# Patient Record
Sex: Female | Born: 1990 | Race: White | Hispanic: No | Marital: Single | Smoking: Former smoker
Health system: Southern US, Community
[De-identification: ages and names within clinical notes are randomized; demographics above are authoritative.]

## PROBLEM LIST (undated history)

## (undated) ENCOUNTER — Inpatient Hospital Stay (HOSPITAL_COMMUNITY): Payer: Self-pay

## (undated) DIAGNOSIS — Z789 Other specified health status: Secondary | ICD-10-CM

## (undated) HISTORY — PX: OTHER SURGICAL HISTORY: SHX169

---
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
## 2012-04-29 ENCOUNTER — Emergency Department (HOSPITAL_COMMUNITY)
Admission: EM | Admit: 2012-04-29 | Discharge: 2012-04-29 | Disposition: A | Discharge status: Home / Self Care | Payer: Self-pay

## 2012-04-29 NOTE — ED Notes (Signed)
Pt states she "just wants to go home and go to bed". Will come back tomorrow if she is not better.

## 2012-06-24 ENCOUNTER — Encounter (HOSPITAL_COMMUNITY): Payer: Self-pay | Admitting: Emergency Medicine

## 2012-06-24 ENCOUNTER — Emergency Department (HOSPITAL_COMMUNITY)
Admission: EM | Admit: 2012-06-24 | Discharge: 2012-06-24 | Disposition: A | Discharge status: Home / Self Care | Payer: Medicaid Other | Attending: Emergency Medicine | Admitting: Emergency Medicine

## 2012-06-24 DIAGNOSIS — Z3490 Encounter for supervision of normal pregnancy, unspecified, unspecified trimester: Secondary | ICD-10-CM

## 2012-06-24 DIAGNOSIS — E860 Dehydration: Secondary | ICD-10-CM | POA: Insufficient documentation

## 2012-06-24 DIAGNOSIS — Z331 Pregnant state, incidental: Secondary | ICD-10-CM | POA: Insufficient documentation

## 2012-06-24 DIAGNOSIS — R824 Acetonuria: Secondary | ICD-10-CM | POA: Insufficient documentation

## 2012-06-24 LAB — URINE MICROSCOPIC-ADD ON

## 2012-06-24 LAB — URINALYSIS, ROUTINE W REFLEX MICROSCOPIC
Bilirubin Urine: NEGATIVE
Glucose, UA: NEGATIVE mg/dL
Glucose, UA: NEGATIVE mg/dL
Hgb urine dipstick: NEGATIVE
Ketones, ur: >80 mg/dL — AB
Leukocytes, UA: NEGATIVE
Leukocytes, UA: NEGATIVE
Nitrite: NEGATIVE
Protein, ur: NEGATIVE mg/dL
Protein, ur: NEGATIVE mg/dL
Specific Gravity, Urine: 1.028 (ref 1.005–1.030)
Specific Gravity, Urine: 1.013 (ref 1.005–1.030)
Urobilinogen, UA: 1 mg/dL (ref 0.0–1.0)
Urobilinogen, UA: 1 mg/dL (ref 0.0–1.0)
pH: 6 (ref 5.0–8.0)

## 2012-06-24 LAB — POCT PREGNANCY, URINE: Preg Test, Ur: POSITIVE — AB

## 2012-06-24 MED ORDER — ONDANSETRON HCL 4 MG/2ML IJ SOLN
4.0000 mg | Freq: Once | INTRAMUSCULAR | Status: AC
  Administered 2012-06-24: 4 mg via INTRAVENOUS
  Filled 2012-06-24: qty 2

## 2012-06-24 MED ORDER — SODIUM CHLORIDE 0.9 % IV BOLUS (SEPSIS)
1000.0000 mL | Freq: Once | INTRAVENOUS | Status: AC
  Administered 2012-06-24: 1000 mL via INTRAVENOUS

## 2012-06-24 MED ORDER — PROMETHAZINE HCL 25 MG PO TABS: 25.0000 mg | ORAL_TABLET | Freq: Four times a day (QID) | ORAL | Status: DC | PRN

## 2012-06-24 MED ORDER — SODIUM CHLORIDE 0.9 % IV BOLUS (SEPSIS)
500.0000 mL | Freq: Once | INTRAVENOUS | Status: AC
  Administered 2012-06-24: 500 mL via INTRAVENOUS

## 2012-06-24 MED ORDER — PROMETHAZINE HCL 25 MG/ML IJ SOLN
25.0000 mg | Freq: Once | INTRAMUSCULAR | Status: AC
  Administered 2012-06-24: 25 mg via INTRAVENOUS
  Filled 2012-06-24: qty 1

## 2012-06-24 MED ORDER — SODIUM CHLORIDE 0.9 % IV BOLUS (SEPSIS)
1000.0000 mL | Freq: Once | INTRAVENOUS | Status: AC
  Administered 2012-06-24: 500 mL via INTRAVENOUS
  Administered 2012-06-24: 1000 mL via INTRAVENOUS

## 2012-06-24 MED ORDER — ONDANSETRON HCL 4 MG PO TABS: 4.0000 mg | ORAL_TABLET | Freq: Four times a day (QID) | ORAL | Status: AC

## 2012-06-24 NOTE — ED Notes (Signed)
Pt presenting to ed with c/o "i think I'm pregnant I have been really sick I took a pregnancy test and it was positive" pt states she was informed to present to Ed and get some phenergan pt states she has had nausea all her life and phenergan helps. Pt states she has not seen an ob/gyn yet. Pt states positive nausea and vomiting x 3 days.

## 2012-06-24 NOTE — ED Provider Notes (Signed)
Medical screening examination/treatment/procedure(s) were conducted as a shared visit with non-physician practitioner(s) and myself.  I personally evaluated the patient during the encounter  Nicolaos Mitrano, MD 06/24/12 2337 

## 2012-06-24 NOTE — ED Provider Notes (Addendum)
Medical screening examination/treatment/procedure(s) were conducted as a shared visit with non-physician practitioner(s) and myself.  I personally evaluated the patient during the encounter She is in first trimester. She c/o n/v for a few days. No diarrhea. No pain. No fever, chills, uti sxs.  On exam no distress.   Mild tachycardia.  + murmur with exhalation.  Lungs and abd nl.  + dehydration.  Will give iv fluids and zofran.   Cheri Guppy, MD 06/24/12 1813  9:29 PM Still nauseated  Cheri Guppy, MD 06/24/12 2129  sxs controlled now   Cheri Guppy, MD 06/24/12 2226

## 2012-06-24 NOTE — ED Provider Notes (Signed)
History     CSN: [id number]  Arrival date & time 06/24/12  1539   First MD Initiated Contact with Patient 06/24/12 1659      Chief Complaint  Patient presents with  . pregnant   . Morning Sickness    (Consider location/radiation/quality/duration/timing/severity/associated sxs/prior treatment) HPI Comments: Cathy [name] 21 y.o. female   The chief complaint is: Patient presents with:   pregnant    Morning Sickness   The patient has medical history significant for:   History reviewed. No pertinent past medical history.  Patient G1P0A0 presents with a chief complain of nausea every 20-30 minutes. Patient states that an at home pregnancy test was positive so she called her OB/GYN. He instructed her to come to the ED for hydration and antiemetics to avoid dehydration. Patient reports that she is 3-[redacted] weeks pregnant according to her menses. Denies fever or chills. Denies diarrhea, constipations or abdominal pain. Denies CP, SOB, or palpitations. Denies vaginal bleeding, dysuria, urgency, or frequency.      The history is provided by the patient.    History reviewed. No pertinent past medical history.  History reviewed. No pertinent past surgical history.  No family history on file.  History  Substance Use Topics  . Smoking status: Never Smoker   . Smokeless tobacco: Not on file  . Alcohol Use: No    OB History    Grav Para Term Preterm Abortions TAB SAB Ect Mult Living                  Review of Systems  Constitutional: Negative for fever, chills and activity change.  Respiratory: Negative for shortness of breath.   Cardiovascular: Negative for chest pain and palpitations.  Gastrointestinal: Positive for nausea and vomiting. Negative for abdominal pain and constipation.  Genitourinary: Negative for dysuria, urgency, frequency and vaginal bleeding.    Allergies  Cyclinex  Home Medications  No current outpatient prescriptions on file.  BP 110/68  Pulse  108  Temp 98.8 F (37.1 C) (Oral)  Resp 16  SpO2 99%  Physical Exam  Nursing note and vitals reviewed. Constitutional: She appears well-developed and well-nourished.  HENT:  Head: Normocephalic and atraumatic.  Mouth/Throat: Oropharynx is clear and moist.  Eyes: Conjunctivae and EOM are normal. Pupils are equal, round, and reactive to light. No scleral icterus.  Cardiovascular: Regular rhythm and normal heart sounds.   Pulmonary/Chest: Effort normal and breath sounds normal.  Abdominal: Soft. Bowel sounds are normal. There is no tenderness.  Neurological: She is alert.  Skin: Skin is warm and dry.    ED Course  Procedures (including critical care time)  Labs Reviewed  POCT PREGNANCY, URINE - Abnormal; Notable for the following:    Preg Test, Ur POSITIVE (*)     All other components within normal limits  URINALYSIS, ROUTINE W REFLEX MICROSCOPIC   No results found. Results for orders placed during the hospital encounter of 06/24/12  URINALYSIS, ROUTINE W REFLEX MICROSCOPIC      Component Value Range   Color, Urine YELLOW  YELLOW   APPearance CLOUDY (*) CLEAR   Specific Gravity, Urine 1.028  1.005 - 1.030   pH 6.0  5.0 - 8.0   Glucose, UA NEGATIVE  NEGATIVE mg/dL   Hgb urine dipstick NEGATIVE  NEGATIVE   Bilirubin Urine SMALL (*) NEGATIVE   Ketones, ur >80 (*) NEGATIVE mg/dL   Protein, ur NEGATIVE  NEGATIVE mg/dL   Urobilinogen, UA 1.0  0.0 - 1.0 mg/dL   Nitrite  NEGATIVE  NEGATIVE   Leukocytes, UA NEGATIVE  NEGATIVE  POCT PREGNANCY, URINE      Component Value Range   Preg Test, Ur POSITIVE (*) NEGATIVE     1. Pregnancy   2. Urine ketones   3. Dehydration       MDM  Patient presents 3-[redacted] weeks pregnant with complaint of nausea and vomiting every 20-30 minutes. She was instructed by her OB/GYN to report to the ED to avoid dehydration.UA: remarkable for Ketones: Fluids and Zofran given in the ER with symptom improvement. UA: repeated and ketones decreased. More  fluids given and repeat UA will determine time of discharge  Patient educated on the importance of hydration and limitation of caffeine. Patient instructed to start prenatal vitamin and folic acid. Patient discharged on Zofran with plans to see her OB/GYN on Monday. No red flags for abortion, ectopic pregnancy, or hyperemesis gravidarum. Return precautions given verbally and in discharge instructions. Patient signed off to Dr. Weldon Inches.        Pixie Casino, PA-C 06/24/12 2057

## 2012-06-24 NOTE — ED Notes (Signed)
Pt given something to drink.  MD aware and states that this is okay.

## 2012-06-24 NOTE — ED Notes (Signed)
Pt has already received 500cc of sodium bolus

## 2012-07-02 ENCOUNTER — Emergency Department (HOSPITAL_COMMUNITY)
Admission: EM | Admit: 2012-07-02 | Discharge: 2012-07-02 | Discharge status: Against Medical Advice | Payer: Medicaid Other | Attending: Emergency Medicine | Admitting: Emergency Medicine

## 2012-07-02 ENCOUNTER — Encounter (HOSPITAL_COMMUNITY): Payer: Self-pay | Admitting: *Deleted

## 2012-07-02 DIAGNOSIS — O210 Mild hyperemesis gravidarum: Secondary | ICD-10-CM | POA: Insufficient documentation

## 2012-07-02 NOTE — ED Notes (Signed)
Pt c/o continued emesis x 3 weeks. Pt is G1P0, 5.5 wks. Pt states unable to keep food and fluids down, unable to keep phenergan pills down at home.

## 2012-08-30 ENCOUNTER — Emergency Department (HOSPITAL_COMMUNITY)
Admission: EM | Admit: 2012-08-30 | Discharge: 2012-08-30 | Disposition: A | Discharge status: Home / Self Care | Payer: Medicaid Other | Attending: Emergency Medicine | Admitting: Emergency Medicine

## 2012-08-30 ENCOUNTER — Encounter (HOSPITAL_COMMUNITY): Payer: Self-pay | Admitting: Emergency Medicine

## 2012-08-30 DIAGNOSIS — Z331 Pregnant state, incidental: Secondary | ICD-10-CM | POA: Insufficient documentation

## 2012-08-30 DIAGNOSIS — E860 Dehydration: Secondary | ICD-10-CM | POA: Insufficient documentation

## 2012-08-30 DIAGNOSIS — Z3490 Encounter for supervision of normal pregnancy, unspecified, unspecified trimester: Secondary | ICD-10-CM

## 2012-08-30 LAB — POCT I-STAT, CHEM 8
BUN: 5 mg/dL — ABNORMAL LOW (ref 6–23)
HCT: 35 % — ABNORMAL LOW (ref 36.0–46.0)
Hemoglobin: 11.9 g/dL — ABNORMAL LOW (ref 12.0–15.0)
Sodium: 138 mEq/L (ref 135–145)
TCO2: 23 mmol/L (ref 0–100)

## 2012-08-30 LAB — GLUCOSE, CAPILLARY

## 2012-08-30 MED ORDER — SODIUM CHLORIDE 0.9 % IV BOLUS (SEPSIS)
1000.0000 mL | Freq: Once | INTRAVENOUS | Status: AC
  Administered 2012-08-30: 1000 mL via INTRAVENOUS

## 2012-08-30 MED ORDER — ONDANSETRON HCL 4 MG PO TABS: 4.0000 mg | ORAL_TABLET | Freq: Four times a day (QID) | ORAL | Status: DC

## 2012-08-30 MED ORDER — PRENATAL COMPLETE 14-0.4 MG PO TABS: 1.0000 | ORAL_TABLET | Freq: Every day | ORAL | Status: DC

## 2012-08-30 NOTE — ED Notes (Signed)
[id number]<AV> Expected date:<BR> Expected time:<BR> Means of arrival:<BR> Comments:<BR> Hold for triage 2

## 2012-08-30 NOTE — ED Notes (Signed)
Pt was taking a shower this morning and told her boyfriend that she thought she was going to pass out.  He caught her and gently lowered her to the floor.  She came to after "a minute or so". Pt states she is [redacted] wks pregnant.  Pt was last seen at 6 wks for a prenatal visit but is having trouble with medicaid.

## 2012-08-30 NOTE — ED Provider Notes (Signed)
Medical screening examination/treatment/procedure(s) were performed by non-physician practitioner and as supervising physician I was immediately available for consultation/collaboration.   Gianny Killman B. Bernette Mayers, MD 08/30/12 [phone]

## 2012-08-30 NOTE — ED Notes (Signed)
Pt reports taking a hot shower, becoming dizzy, and passing out for approximately a minute around 2 hr prior to arrival to the ER. Pt denies hitting head. Boyfriend caught her before she fell. Pt reports no difficulty with her pregnancy.

## 2012-08-30 NOTE — ED Provider Notes (Signed)
History     CSN: [id number]  Arrival date & time 08/30/12  1111   First MD Initiated Contact with Patient 08/30/12 1205      Chief Complaint  Patient presents with  . Loss of Consciousness  . Pregnant     [redacted] wks    (Consider location/radiation/quality/duration/timing/severity/associated sxs/prior treatment) HPI  The patient is here for brief fainting spell while in the hot shower this morning. Her boyfriend caught her,turned on cold water and she quickly came back around, he says. She says that the last time she ate was 8 PM yesterday night and in that she slept all through the night and woke up around 10 AM this morning. She states that she did not eat or drink anything from 8 PM up until now almost 1 PM the next day. She says that when she woke she got into the hot shower and after doing really hot she felt like she's going to faint, pulled her boyfriend and then did have a brief unconscious spell. She denies having any other associated symptoms of chest pain or N/V and she is completely back to baseline at this time. Confirmed IUP and fetal movements are normal. NAD/VSS  History reviewed. No pertinent past medical history.  History reviewed. No pertinent past surgical history.  History reviewed. No pertinent family history.  History  Substance Use Topics  . Smoking status: Never Smoker   . Smokeless tobacco: Not on file  . Alcohol Use: No    OB History    Grav Para Term Preterm Abortions TAB SAB Ect Mult Living   1               Review of Systems   Review of Systems  Gen: no weight loss, fevers, chills, night sweats, syncope Eyes: no discharge or drainage, no occular pain or visual changes  Nose: no epistaxis or rhinorrhea  Mouth: no dental pain, no sore throat  Neck: no neck pain  Lungs:No wheezing, coughing or hemoptysis CV: no chest pain, palpitations, dependent edema or orthopnea  Abd: no abdominal pain, nausea, vomiting  GU: no dysuria or gross hematuria    MSK:  No abnormalities  Neuro: no headache, no focal neurologic deficits  Skin: no abnormalities Psyche: negative.    Allergies  Cyclinex  Home Medications   Current Outpatient Rx  Name Route Sig Dispense Refill  . ACETAMINOPHEN 325 MG PO TABS Oral Take 650 mg by mouth every 6 (six) hours as needed. Pain    . PRENATAL 27-0.8 MG PO TABS Oral Take 1 tablet by mouth daily.      BP 95/63  Pulse 62  Temp 97.9 F (36.6 C) (Oral)  Resp 18  Ht 5\' 5"  (1.651 m)  SpO2 100%  LMP 05/16/2012  Physical Exam  Nursing note and vitals reviewed. Constitutional: She appears well-developed and well-nourished. No distress.  HENT:  Head: Normocephalic and atraumatic.  Eyes: Pupils are equal, round, and reactive to light.  Neck: Normal range of motion. Neck supple.  Cardiovascular: Normal rate and regular rhythm.   Pulmonary/Chest: Effort normal.  Abdominal: Soft.  Genitourinary: Uterus is enlarged. Uterus is not deviated, not fixed and not tender.  Neurological: She is alert.  Skin: Skin is warm and dry.    ED Course  Procedures (including critical care time)  Labs Reviewed  POCT I-STAT, CHEM 8 - Abnormal; Notable for the following:    BUN 5 (*)     Calcium, Ion 1.25 (*)  Hemoglobin 11.9 (*)     HCT 35.0 (*)     All other components within normal limits  GLUCOSE, CAPILLARY   No results found.   No diagnosis found. Dx; 1. Dehydration 2. Pregnancy  3. Orthostatic hypotension  4. pregnancy   MDM  Pt has positive orthostatics. She is asymptomatic in the ED. Labs are all non acute.  Pt received 2 L of normal Saline in the ED.   Date: 08/30/2012  Rate: 78  Rhythm: normal sinus rhythm  QRS Axis: normal  Intervals: normal  ST/T Wave abnormalities: normal  Conduction Disutrbances:none  Narrative Interpretation:   Old EKG Reviewed: unchanged  Pt has confirmed IUP from OB. She has recently moved and no longer has OB doctor.   Will give Zofran, Prenatals, education and  OB referral.  Pt has been advised of the symptoms that warrant their return to the ED. Patient has voiced understanding and has agreed to follow-up with the PCP or specialist.         Dorthula Matas, PA 08/30/12 1359

## 2012-10-20 LAB — OB RESULTS CONSOLE HEPATITIS B SURFACE ANTIGEN: Hepatitis B Surface Ag: NEGATIVE

## 2012-10-20 LAB — OB RESULTS CONSOLE ANTIBODY SCREEN: Antibody Screen: NEGATIVE

## 2012-10-20 LAB — OB RESULTS CONSOLE ABO/RH: RH Type: POSITIVE

## 2012-10-20 LAB — OB RESULTS CONSOLE GC/CHLAMYDIA: Chlamydia: NEGATIVE

## 2012-11-16 NOTE — L&D Delivery Note (Signed)
Patient was C/C/+2 and pushed for 10 minutes with epidural.   NSVD  female infant, Apgars 7/8, weight pending.   The patient had bilateral superficial labial lacerations reapproximated with 3-0 vicryl. Fundus was firm. EBL was expected. Placenta was delivered intact. Vagina was clear.  Baby was vigorous to bedside.  Cathy [name]

## 2013-01-14 ENCOUNTER — Encounter (HOSPITAL_COMMUNITY): Payer: Self-pay | Admitting: *Deleted

## 2013-01-14 ENCOUNTER — Inpatient Hospital Stay (HOSPITAL_COMMUNITY)
Admission: AD | Admit: 2013-01-14 | Discharge: 2013-01-14 | Disposition: A | Discharge status: Home / Self Care | Payer: Medicaid Other | Source: Ambulatory Visit | Attending: Obstetrics & Gynecology | Admitting: Obstetrics & Gynecology

## 2013-01-14 DIAGNOSIS — O479 False labor, unspecified: Secondary | ICD-10-CM

## 2013-01-14 DIAGNOSIS — M549 Dorsalgia, unspecified: Secondary | ICD-10-CM | POA: Insufficient documentation

## 2013-01-14 DIAGNOSIS — O4700 False labor before 37 completed weeks of gestation, unspecified trimester: Secondary | ICD-10-CM | POA: Insufficient documentation

## 2013-01-14 LAB — URINE MICROSCOPIC-ADD ON

## 2013-01-14 LAB — URINALYSIS, ROUTINE W REFLEX MICROSCOPIC
Glucose, UA: NEGATIVE mg/dL
Ketones, ur: >80 mg/dL — AB
Leukocytes, UA: NEGATIVE
Protein, ur: 30 mg/dL — AB
Urobilinogen, UA: 1 mg/dL (ref 0.0–1.0)

## 2013-01-14 MED ORDER — LACTATED RINGERS IV BOLUS (SEPSIS)
1000.0000 mL | Freq: Once | INTRAVENOUS | Status: AC
  Administered 2013-01-14: 1000 mL via INTRAVENOUS

## 2013-01-14 MED ORDER — IBUPROFEN 600 MG PO TABS
600.0000 mg | ORAL_TABLET | Freq: Once | ORAL | Status: AC
  Administered 2013-01-14: 600 mg via ORAL
  Filled 2013-01-14: qty 1

## 2013-01-14 MED ORDER — NIFEDIPINE 10 MG PO CAPS
10.0000 mg | ORAL_CAPSULE | Freq: Once | ORAL | Status: AC
  Administered 2013-01-14: 10 mg via ORAL
  Filled 2013-01-14: qty 1

## 2013-01-14 MED ORDER — DEXTROSE IN LACTATED RINGERS 5 % IV SOLN
INTRAVENOUS | Status: DC
  Administered 2013-01-14: 13:00:00 via INTRAVENOUS

## 2013-01-14 MED ORDER — PROMETHAZINE HCL 25 MG PO TABS
25.0000 mg | ORAL_TABLET | Freq: Once | ORAL | Status: AC
  Administered 2013-01-14: 25 mg via ORAL
  Filled 2013-01-14: qty 1

## 2013-01-14 NOTE — MAU Note (Addendum)
Pt reports she has been having a constant back pain like a pinched nerve (several weeks). Yesterday the pain got more intense  but more intermittent  Pian/cramping like menstrual  Cramping. Pain is not like it was last night more like pinched nerve pain now. Pt reports she has had N/V since Thursday. Not been able to keep anything down since 3am Thursday morning

## 2013-01-14 NOTE — MAU Provider Note (Signed)
History     CSN: [id number]  Arrival date and time: 01/14/13 1131   First Provider Initiated Contact with Patient 01/14/13 1218      Chief Complaint  Patient presents with  . Back Pain   HPI Ms. Cathy [name] is a 22 y.o. G1P0 at [redacted]w[redacted]d who presents to MAU today with complaint of back pain and pressure. The patient called Dr. Arlyce Dice and was instructed to come in for evaluation for preterm labor. The patient states that she started having these pains yesterday and they were coming and going like contractions every 15-20 minutes. They stopped last night around 7 pm and then she became very nauseous and was vomiting "a lot." When she woke up this morning they started again every 10-15 minutes. She denies fever, LOF or vaginal bleeding. She reports good fetal movement.   OB History   Grav Para Term Preterm Abortions TAB SAB Ect Mult Living   1               History reviewed. No pertinent past medical history.  History reviewed. No pertinent past surgical history.  History reviewed. No pertinent family history.  History  Substance Use Topics  . Smoking status: Never Smoker   . Smokeless tobacco: Not on file  . Alcohol Use: No    Allergies:  Allergies  Allergen Reactions  . Cyclinex (Tetracycline) Hives and Nausea And Vomiting  . Minocycline Hives and Nausea And Vomiting    Prescriptions prior to admission  Medication Sig Dispense Refill  . acetaminophen (TYLENOL) 325 MG tablet Take 650 mg by mouth every 6 (six) hours as needed. Pain      . Prenatal Vit-Fe Fumarate-FA (PRENATAL MULTIVITAMIN) TABS Take 1 tablet by mouth daily at 12 noon.        Review of Systems  Constitutional: Negative for fever, chills and malaise/fatigue.  Gastrointestinal: Positive for nausea and vomiting. Negative for abdominal pain.  Genitourinary: Negative for dysuria, urgency and frequency.  Musculoskeletal: Positive for back pain.   Physical Exam   Blood pressure 120/67, pulse 108,  temperature 98.1 F (36.7 C), temperature source Oral, resp. rate 18, height 5\' 5"  (1.651 m), weight 163 lb 6.4 oz (74.118 kg), last menstrual period 05/22/2012.  Physical Exam  Constitutional: She is oriented to person, place, and time. She appears well-developed and well-nourished.  HENT:  Head: Normocephalic and atraumatic.  Cardiovascular: Normal rate, regular rhythm and normal heart sounds.   Respiratory: Effort normal and breath sounds normal. No respiratory distress.  GI: Soft. Bowel sounds are normal. She exhibits no distension and no mass. There is no tenderness. There is no rebound and no guarding.  Neurological: She is alert and oriented to person, place, and time.  Skin: Skin is warm and dry. No erythema.  Psychiatric: She has a normal mood and affect.  Dilation: 1 Effacement (%): Thick Cervical Position: Middle Station: -3 Exam by:: Ginger Morris RN  Fetal Monitoring: Baseline: 150 bpm, moderate variability, + acceleration, no decelerations Contractions: q 2-4 minutes  Results for orders placed during the hospital encounter of 01/14/13 (from the past 24 hour(s))  URINALYSIS, ROUTINE W REFLEX MICROSCOPIC     Status: Abnormal   Collection Time    01/14/13 11:45 AM      Result Value Range   Color, Urine YELLOW  YELLOW   APPearance CLEAR  CLEAR   Specific Gravity, Urine >1.030 (*) 1.005 - 1.030   pH 6.0  5.0 - 8.0   Glucose, UA NEGATIVE  NEGATIVE mg/dL   Hgb urine dipstick MODERATE (*) NEGATIVE   Bilirubin Urine NEGATIVE  NEGATIVE   Ketones, ur >80 (*) NEGATIVE mg/dL   Protein, ur 30 (*) NEGATIVE mg/dL   Urobilinogen, UA 1.0  0.0 - 1.0 mg/dL   Nitrite NEGATIVE  NEGATIVE   Leukocytes, UA NEGATIVE  NEGATIVE  URINE MICROSCOPIC-ADD ON     Status: Abnormal   Collection Time    01/14/13 11:45 AM      Result Value Range   Squamous Epithelial / LPF FEW (*) RARE   WBC, UA 3-6  <3 WBC/hpf   RBC / HPF 11-20  <3 RBC/hpf   Bacteria, UA FEW (*) RARE    MAU Course   Procedures None  MDM 1 L LR IV Discussed patient with Dr. Arlyce Dice. He recommends10 mg procardia now 1253 - Procardia given Fetal Monitoring: Baseline: 150 bpm, moderate activity, + acceleration, no deceleration Contractions: q 6 minutes, progressed to irritability Patient is reporting less contractions but still having significant back pain Discussed patient with Dr. Arlyce Dice. He recommends 600 mg one time dose of Ibuprofen. Patient may be discharged after fluids have finished. Follow-up in the office this week.  Rechecked cervix: No change in dilation; more posterior now that contractions have stopped.  At time of discharge patient feels nauseous. Phenergan given PO prior to discharge. Patient has Rx for Zofran and Phenergan at home.   Assessment and Plan  A: Preterm contractions  P: Discharge home Labor precautions discussed Patient will call to make follow-up appointment in the office this week Patient may return to MAU as needed or if her condition were to change or worsen  Freddi Starr, PA-C  01/14/2013, 12:18 PM

## 2013-01-14 NOTE — Progress Notes (Signed)
Patient called with complaints of back pain and pressure that might be consistent with premature labor.  I asked her to go th MAU for cervical evaluation to rule out preterm labor.

## 2013-01-15 LAB — URINE CULTURE

## 2013-02-07 ENCOUNTER — Other Ambulatory Visit: Payer: Self-pay | Admitting: Obstetrics and Gynecology

## 2013-02-25 ENCOUNTER — Encounter (HOSPITAL_COMMUNITY): Payer: Self-pay | Admitting: *Deleted

## 2013-02-25 ENCOUNTER — Encounter (HOSPITAL_COMMUNITY): Payer: Self-pay | Admitting: Anesthesiology

## 2013-02-25 ENCOUNTER — Inpatient Hospital Stay (HOSPITAL_COMMUNITY)
Admission: AD | Admit: 2013-02-25 | Discharge: 2013-02-27 | DRG: 775 | Disposition: A | Discharge status: Home / Self Care | Payer: Medicaid Other | Source: Ambulatory Visit | Attending: Obstetrics and Gynecology | Admitting: Obstetrics and Gynecology

## 2013-02-25 ENCOUNTER — Inpatient Hospital Stay (HOSPITAL_COMMUNITY): Payer: Medicaid Other | Admitting: Anesthesiology

## 2013-02-25 DIAGNOSIS — Z3480 Encounter for supervision of other normal pregnancy, unspecified trimester: Secondary | ICD-10-CM

## 2013-02-25 LAB — CBC
HCT: 34.9 % — ABNORMAL LOW (ref 36.0–46.0)
Hemoglobin: 11.7 g/dL — ABNORMAL LOW (ref 12.0–15.0)
MCH: 31.3 pg (ref 26.0–34.0)
MCHC: 33.5 g/dL (ref 30.0–36.0)
MCV: 93.3 fL (ref 78.0–100.0)
RDW: 14.5 % (ref 11.5–15.5)

## 2013-02-25 MED ORDER — PRENATAL MULTIVITAMIN CH
1.0000 | ORAL_TABLET | Freq: Every day | ORAL | Status: DC
  Administered 2013-02-26 – 2013-02-27 (×2): 1 via ORAL
  Filled 2013-02-25 (×2): qty 1

## 2013-02-25 MED ORDER — LACTATED RINGERS IV SOLN
INTRAVENOUS | Status: DC
  Administered 2013-02-25: 13:00:00 via INTRAVENOUS

## 2013-02-25 MED ORDER — BENZOCAINE-MENTHOL 20-0.5 % EX AERO
1.0000 "application " | INHALATION_SPRAY | CUTANEOUS | Status: DC | PRN
  Administered 2013-02-26: 1 via TOPICAL
  Filled 2013-02-25 (×2): qty 56

## 2013-02-25 MED ORDER — EPHEDRINE 5 MG/ML INJ: 10.0000 mg | INTRAVENOUS | Status: DC | PRN

## 2013-02-25 MED ORDER — OXYCODONE-ACETAMINOPHEN 5-325 MG PO TABS: 1.0000 | ORAL_TABLET | ORAL | Status: DC | PRN

## 2013-02-25 MED ORDER — LIDOCAINE HCL (PF) 1 % IJ SOLN
INTRAMUSCULAR | Status: DC | PRN
  Administered 2013-02-25: 3 mL
  Administered 2013-02-25 (×2): 5 mL

## 2013-02-25 MED ORDER — WITCH HAZEL-GLYCERIN EX PADS: 1.0000 "application " | MEDICATED_PAD | CUTANEOUS | Status: DC | PRN

## 2013-02-25 MED ORDER — OXYTOCIN BOLUS FROM INFUSION: 500.0000 mL | INTRAVENOUS | Status: DC

## 2013-02-25 MED ORDER — SIMETHICONE 80 MG PO CHEW: 80.0000 mg | CHEWABLE_TABLET | ORAL | Status: DC | PRN

## 2013-02-25 MED ORDER — ONDANSETRON HCL 4 MG/2ML IJ SOLN: 4.0000 mg | Freq: Four times a day (QID) | INTRAMUSCULAR | Status: DC | PRN

## 2013-02-25 MED ORDER — ONDANSETRON HCL 4 MG PO TABS
4.0000 mg | ORAL_TABLET | ORAL | Status: DC | PRN
  Administered 2013-02-25: 4 mg via ORAL
  Filled 2013-02-25: qty 1

## 2013-02-25 MED ORDER — IBUPROFEN 600 MG PO TABS
600.0000 mg | ORAL_TABLET | Freq: Four times a day (QID) | ORAL | Status: DC
  Administered 2013-02-25 – 2013-02-27 (×8): 600 mg via ORAL
  Filled 2013-02-25 (×8): qty 1

## 2013-02-25 MED ORDER — PHENYLEPHRINE 40 MCG/ML (10ML) SYRINGE FOR IV PUSH (FOR BLOOD PRESSURE SUPPORT)
80.0000 ug | PREFILLED_SYRINGE | INTRAVENOUS | Status: DC | PRN
  Filled 2013-02-25: qty 5

## 2013-02-25 MED ORDER — LACTATED RINGERS IV SOLN
500.0000 mL | Freq: Once | INTRAVENOUS | Status: AC
  Administered 2013-02-25: 1000 mL via INTRAVENOUS

## 2013-02-25 MED ORDER — ONDANSETRON HCL 4 MG/2ML IJ SOLN: 4.0000 mg | INTRAMUSCULAR | Status: DC | PRN

## 2013-02-25 MED ORDER — EPHEDRINE 5 MG/ML INJ
10.0000 mg | INTRAVENOUS | Status: DC | PRN
  Filled 2013-02-25: qty 4

## 2013-02-25 MED ORDER — LIDOCAINE HCL (PF) 1 % IJ SOLN
30.0000 mL | INTRAMUSCULAR | Status: DC | PRN
  Filled 2013-02-25: qty 30

## 2013-02-25 MED ORDER — CITRIC ACID-SODIUM CITRATE 334-500 MG/5ML PO SOLN: 30.0000 mL | ORAL | Status: DC | PRN

## 2013-02-25 MED ORDER — PHENYLEPHRINE 40 MCG/ML (10ML) SYRINGE FOR IV PUSH (FOR BLOOD PRESSURE SUPPORT): 80.0000 ug | PREFILLED_SYRINGE | INTRAVENOUS | Status: DC | PRN

## 2013-02-25 MED ORDER — TETANUS-DIPHTH-ACELL PERTUSSIS 5-2.5-18.5 LF-MCG/0.5 IM SUSP
0.5000 mL | Freq: Once | INTRAMUSCULAR | Status: AC
  Administered 2013-02-27: 0.5 mL via INTRAMUSCULAR
  Filled 2013-02-25 (×2): qty 0.5

## 2013-02-25 MED ORDER — ACETAMINOPHEN 325 MG PO TABS: 650.0000 mg | ORAL_TABLET | ORAL | Status: DC | PRN

## 2013-02-25 MED ORDER — SENNOSIDES-DOCUSATE SODIUM 8.6-50 MG PO TABS
2.0000 | ORAL_TABLET | Freq: Every day | ORAL | Status: DC
  Administered 2013-02-25 – 2013-02-26 (×2): 2 via ORAL

## 2013-02-25 MED ORDER — LANOLIN HYDROUS EX OINT: TOPICAL_OINTMENT | CUTANEOUS | Status: DC | PRN

## 2013-02-25 MED ORDER — FENTANYL 2.5 MCG/ML BUPIVACAINE 1/10 % EPIDURAL INFUSION (WH - ANES)
INTRAMUSCULAR | Status: DC | PRN
  Administered 2013-02-25: 14 mL/h via EPIDURAL

## 2013-02-25 MED ORDER — BUTORPHANOL TARTRATE 1 MG/ML IJ SOLN
INTRAMUSCULAR | Status: AC
  Administered 2013-02-25: 1 mg via INTRAMUSCULAR
  Filled 2013-02-25: qty 1

## 2013-02-25 MED ORDER — LACTATED RINGERS IV SOLN: 500.0000 mL | INTRAVENOUS | Status: DC | PRN

## 2013-02-25 MED ORDER — DIBUCAINE 1 % RE OINT
1.0000 "application " | TOPICAL_OINTMENT | RECTAL | Status: DC | PRN
  Filled 2013-02-25: qty 28

## 2013-02-25 MED ORDER — FENTANYL 2.5 MCG/ML BUPIVACAINE 1/10 % EPIDURAL INFUSION (WH - ANES)
14.0000 mL/h | INTRAMUSCULAR | Status: DC | PRN
  Filled 2013-02-25: qty 125

## 2013-02-25 MED ORDER — FENTANYL 2.5 MCG/ML BUPIVACAINE 1/10 % EPIDURAL INFUSION (WH - ANES): 14.0000 mL/h | INTRAMUSCULAR | Status: DC | PRN

## 2013-02-25 MED ORDER — DIPHENHYDRAMINE HCL 50 MG/ML IJ SOLN: 12.5000 mg | INTRAMUSCULAR | Status: DC | PRN

## 2013-02-25 MED ORDER — IBUPROFEN 600 MG PO TABS: 600.0000 mg | ORAL_TABLET | Freq: Four times a day (QID) | ORAL | Status: DC | PRN

## 2013-02-25 MED ORDER — BUTORPHANOL TARTRATE 1 MG/ML IJ SOLN
1.0000 mg | Freq: Once | INTRAMUSCULAR | Status: AC
  Administered 2013-02-25: 1 mg via INTRAMUSCULAR

## 2013-02-25 MED ORDER — DIPHENHYDRAMINE HCL 25 MG PO CAPS: 25.0000 mg | ORAL_CAPSULE | Freq: Four times a day (QID) | ORAL | Status: DC | PRN

## 2013-02-25 MED ORDER — FLEET ENEMA 7-19 GM/118ML RE ENEM: 1.0000 | ENEMA | RECTAL | Status: DC | PRN

## 2013-02-25 MED ORDER — OXYTOCIN 40 UNITS IN LACTATED RINGERS INFUSION - SIMPLE MED
62.5000 mL/h | INTRAVENOUS | Status: DC
  Administered 2013-02-25: 100 mL/h via INTRAVENOUS
  Administered 2013-02-25: 999 mL/h via INTRAVENOUS
  Filled 2013-02-25: qty 1000

## 2013-02-25 MED ORDER — ZOLPIDEM TARTRATE 5 MG PO TABS: 5.0000 mg | ORAL_TABLET | Freq: Every evening | ORAL | Status: DC | PRN

## 2013-02-25 NOTE — Anesthesia Procedure Notes (Signed)
Epidural Patient location during procedure: OB  Staffing Anesthesiologist: Jerzy Roepke Performed by: anesthesiologist   Preanesthetic Checklist Completed: patient identified, site marked, surgical consent, pre-op evaluation, timeout performed, IV checked, risks and benefits discussed and monitors and equipment checked  Epidural Patient position: sitting Prep: ChloraPrep Patient monitoring: heart rate, continuous pulse ox and blood pressure Approach: right paramedian Injection technique: LOR saline  Needle:  Needle type: Tuohy  Needle gauge: 17 G Needle length: 9 cm and 9 Needle insertion depth: 7 cm Catheter type: closed end flexible Catheter size: 20 Guage Catheter at skin depth: 13 cm Test dose: negative  Assessment Events: blood not aspirated, injection not painful, no injection resistance, negative IV test and no paresthesia  Additional Notes   Patient tolerated the insertion well without complications.   

## 2013-02-25 NOTE — Anesthesia Preprocedure Evaluation (Signed)

## 2013-02-25 NOTE — H&P (Signed)
22 y.o. [redacted]w[redacted]d  G1P0 comes in c/o ctx.  Was seen at ER in [facility] last night and found to be 1cm.  Presented to MAU this morning and was 2.5cm.  Recheck 3cm with "bulging bag" and very uncomfortable, writhing in pain.  Otherwise has good fetal movement and no bleeding.  History reviewed. No pertinent past medical history. History reviewed. No pertinent past surgical history.  OB History   Grav Para Term Preterm Abortions TAB SAB Ect Mult Living   1              # Outc Date GA Lbr Len/2nd Wgt Sex Del Anes PTL Lv   1 CUR               History   Social History  . Marital Status: Single    Spouse Name: N/A    Number of Children: N/A  . Years of Education: N/A   Occupational History  . Not on file.   Social History Main Topics  . Smoking status: Never Smoker   . Smokeless tobacco: Not on file  . Alcohol Use: No  . Drug Use: Yes    Special: Marijuana  . Sexually Active: Yes    Birth Control/ Protection: None   Other Topics Concern  . Not on file   Social History Narrative  . No narrative on file   Cyclinex and Minocycline    Prenatal Transfer Tool  Maternal Diabetes: No Genetic Screening: Declined Maternal Ultrasounds/Referrals: Normal - per pt, she transferred to [place] at 21wk, anatomy scan was not faxed to hospital, but pt states she was told everything looked fine and no further [place] needed Fetal Ultrasounds or other Referrals:  None Maternal Substance Abuse:  Yes:  Type: Marijuana Significant Maternal Medications:  None Significant Maternal Lab Results: Lab values include: Group B Strep negative  Other PNC: uncomplicated    Filed Vitals:   02/25/13 1307  BP: 123/75  Pulse: 100  Temp:   Resp: 18     Lungs/Cor:  NAD Abdomen:  soft, gravid Ex:  no cords, erythema SVE:  4/90/0 FHTs:  135, good STV, NST R Toco:  q 4   A/P   Admit to L&D in labor Pt received epidural, feeling more comfortable AROM clear Routine care  GBS Neg  Maeson Purohit

## 2013-02-25 NOTE — MAU Note (Signed)
Pt reprots having ctx since yesterday. Now they are 5-10 min apart and more intense. Reprots some bloody show. Good fetal movement reported.

## 2013-02-26 LAB — CBC
HCT: 28.4 % — ABNORMAL LOW (ref 36.0–46.0)
Hemoglobin: 9.4 g/dL — ABNORMAL LOW (ref 12.0–15.0)
MCH: 30.9 pg (ref 26.0–34.0)
MCV: 93.4 fL (ref 78.0–100.0)
Platelets: 179 10*3/uL (ref 150–400)
RBC: 3.04 MIL/uL — ABNORMAL LOW (ref 3.87–5.11)
WBC: 15.5 10*3/uL — ABNORMAL HIGH (ref 4.0–10.5)

## 2013-02-26 NOTE — Progress Notes (Signed)
Patient is eating, ambulating, voiding.  Pain control is good.  Appropriate lochia.  No complaints.  Filed Vitals:   02/25/13 1947 02/25/13 2030 02/25/13 2130 02/26/13 0130  BP: 133/51 138/72 130/72 93/55  Pulse: 80 64 77 61  Temp:  98.6 F (37 C) 98.5 F (36.9 C) 98.3 F (36.8 C)  TempSrc:  Oral Oral Oral  Resp: 20 20 20 18   Height:      Weight:      SpO2:        Fundus firm Perineum without swelling. No CT  Lab Results  Component Value Date   WBC 15.5* 02/26/2013   HGB 9.4* 02/26/2013   HCT 28.4* 02/26/2013   MCV 93.4 02/26/2013   PLT 179 02/26/2013    A/Positive/-- (12/05 0000)  A/P Post partum day 1.  Routine care.  Expect d/c 4/14.    Philip Aspen

## 2013-02-26 NOTE — Clinical Social Work Note (Signed)
Clinical Social Work Department PSYCHOSOCIAL ASSESSMENT - MATERNAL/CHILD 02/26/2013  Patient:  Cathy [name],Cathy [name]  Account Number:  [id number]  Admit Date:  02/25/2013  Childs Name:   Cathy [name]    Clinical Social Worker:  Kareli Hossain, LCSW   Date/Time:  02/26/2013 12:30 PM  Date Referred:  02/26/2013   Referral source  Physician     Referred reason  Substance Abuse   Other referral source:    I:  FAMILY / HOME ENVIRONMENT Child's legal guardian:  PARENT  Guardian - Name Guardian - Age Guardian - Address  Cathy [name] 22 [address] [place], [place] [address]  Cathy [name]  [address] [place], [place] [address]   Other household support members/support persons Name Relationship DOB  Sherrie Smisek GRAND MOTHER   Jerry (grandmother's boyfriend)     Other support:   MOB and FOB report good family support    II  PSYCHOSOCIAL DATA Information Source:  Patient Interview  Financial and Community Resources Employment:   MOB unemployed  FOB starting employment at [employer]   Financial resources:  Medicaid If Medicaid - County:  [place] Other  Food Stamps  WIC   School / Grade:   Maternity Care Coordinator / Child Services Coordination / Early Interventions:  Cultural issues impacting care:    III  STRENGTHS Strengths  Supportive family/friends  Home prepared for Child (including basic supplies)  Adequate Resources   Strength comment:    IV  RISK FACTORS AND CURRENT PROBLEMS Current Problem:  YES   Risk Factor & Current Problem Patient Issue Family Issue Risk Factor / Current Problem Comment  Substance Abuse Y N infant positive for MJ   N N     V  SOCIAL WORK ASSESSMENT CSW entered MOB's room to MOB and 3 visitors.  CSW asked visitors to leave so that she could speak with MOB privately. CSW discussed any current emotional concerns with MOB.  MOB reports no emotional concerns and no hx of any anxiety or depression.  CSW discussed home  environment. MOB reports she and FOB recently moved in with her mother and her mother's boyfriend in [place] and they are currently the only ones in the home.  CSW discussed medicaid.  MOB reports medicaid is still with [place], however MOB is in the process of transferring to [place].  CSW discussed family support and MOB reported good family support and no current concerns.  CSW discussed any concerns with supplies and MOB reported there is none.  CSW discussed positive drug screen with MOB and hx of use.  MOB reports first used at age 22 and used socially since that time.  MOB reports she had stopped when she found out she was pregnant, and then reported having severe nausea during pregnancy and that "nothing else worked" to control her nausea.  CSW discussed hospital policy to report any positive drug screens to DSS.  MOB was understanding. CSW contacted [place] DSS and made report.  CPS worker informed CSW that a worker would be out to the hospital Monday 4/14 to assess and decide discharge for MOB.  CSW informed MOB and RN about planned visit.  MOB asked that if possible for nurse assistance to remove any visitors from the room when DSS arrives as she does not want other family or friends to know about DSS involvement. MOB reports her mother does know about use.  CSW instructed DSS to check at nurses station before entering room.  CSW will continue   to follow to assist with discharge planning.  Do not discharge infant or MOB until CSW indicates okay from DSS.      VI SOCIAL WORK PLAN Social Work Plan  Psychosocial Support/Ongoing Assessment of Needs   Type of pt/family education:   If child protective services report - county:   If child protective services report - date:   Information/referral to community resources comment:   Other social work plan:    

## 2013-02-26 NOTE — Anesthesia Postprocedure Evaluation (Signed)
  Anesthesia Post-op Note  Patient: Cathy [name]  Procedure(s) Performed: * No procedures listed *  Patient Location: PACU and Mother/Baby  Anesthesia Type:Epidural  Level of Consciousness: awake, alert  and oriented  Airway and Oxygen Therapy: Patient Spontanous Breathing  Post-op Pain: none  Post-op Assessment: Post-op Vital signs reviewed, Patient's Cardiovascular Status Stable, No headache, No backache, No residual numbness and No residual motor weakness  Post-op Vital Signs: Reviewed and stable  Complications: No apparent anesthesia complications

## 2013-02-27 LAB — RPR: RPR Ser Ql: NONREACTIVE

## 2013-02-27 MED ORDER — IBUPROFEN 600 MG PO TABS: 600.0000 mg | ORAL_TABLET | Freq: Four times a day (QID) | ORAL | Status: DC

## 2013-02-27 MED ORDER — SENNOSIDES-DOCUSATE SODIUM 8.6-50 MG PO TABS: 2.0000 | ORAL_TABLET | Freq: Every day | ORAL | Status: DC

## 2013-02-27 NOTE — Discharge Summary (Signed)
Obstetric Discharge Summary Reason for Admission: onset of labor Prenatal Procedures: ultrasound Intrapartum Procedures: spontaneous vaginal delivery Postpartum Procedures: none Complications-Operative and Postpartum: bilateral labial lacerations Hemoglobin  Date Value Range Status  02/26/2013 9.4* 12.0 - 15.0 g/dL Final     REPEATED TO VERIFY     DELTA CHECK NOTED     HCT  Date Value Range Status  02/26/2013 28.4* 36.0 - 46.0 % Final    Physical Exam:  General: alert Lochia: appropriate Uterine Fundus: firm   Discharge Diagnoses: Term Pregnancy-delivered  Discharge Information: Date: 02/27/2013 Activity: pelvic rest Diet: routine Medications: PNV and Ibuprofen Condition: stable Instructions: refer to practice specific booklet Discharge to: home Follow-up Information   Follow up with Levi Aland, MD. Schedule an appointment as soon as possible for a visit in 4 weeks.   Contact information:   [address] [place] [address] [phone]       Newborn Data: Live born female  Birth Weight: 7 lb 1.2 oz (3210 g) APGAR: 7, 9  Home with mother.  Cathy [name] 02/27/2013, 8:33 AM

## 2013-02-27 NOTE — Progress Notes (Signed)
CSW spoke with Carrie Nelson/[place] CPS who states they requested an assist from [place] this morning, but have not received information as to who the case has been assigned to.  CSW attempted to contact [place] to see who the case has been assigned to, but was on hold for over 15 minutes and could not hold any longer.  CSW will attempt again a little later. 

## 2013-02-27 NOTE — Progress Notes (Signed)
CSW has tried numerous times and has left messages at [facility] DSS to inquire about plan, but has not been able to talk with anyone.  CSW left message for C. Nelson/[place] CPS stating that if [place] does not come to see MOB today before 5pm, we will let them discharge and Joni Fears will need to follow up in the home.  CSW has met with parents to update them on the situation and discuss the reason for the delay in discharge today.  They were very pleasant and understanding.

## 2013-02-27 NOTE — Progress Notes (Signed)
UR chart review completed.  

## 2013-02-27 NOTE — Progress Notes (Signed)
Pt and baby are doing well. Ready for discharge.

## 2013-02-28 NOTE — Progress Notes (Signed)
Late Entry: CSW saw CPS Earnestine Mealing who met with parents and cleared them for discharge.  She states Joni Fears will follow up with parents at home.  CSW informed hospital staff.

## 2013-05-29 ENCOUNTER — Encounter (HOSPITAL_COMMUNITY): Payer: Self-pay | Admitting: Emergency Medicine

## 2013-05-29 ENCOUNTER — Emergency Department (HOSPITAL_COMMUNITY)
Admission: EM | Admit: 2013-05-29 | Discharge: 2013-05-29 | Disposition: A | Discharge status: Home / Self Care | Payer: Medicaid Other | Attending: Emergency Medicine | Admitting: Emergency Medicine

## 2013-05-29 DIAGNOSIS — K0889 Other specified disorders of teeth and supporting structures: Secondary | ICD-10-CM

## 2013-05-29 DIAGNOSIS — K089 Disorder of teeth and supporting structures, unspecified: Secondary | ICD-10-CM | POA: Insufficient documentation

## 2013-05-29 MED ORDER — HYDROCODONE-ACETAMINOPHEN 5-325 MG PO TABS: 1.0000 | ORAL_TABLET | ORAL | Status: DC | PRN

## 2013-05-29 NOTE — ED Notes (Signed)
Per pt, states toothache started 11 months ago-right upper wisdom tooth-unable to go to dentist because of insurance reasons

## 2013-06-04 NOTE — ED Provider Notes (Signed)
   History    CSN: [id number] Arrival date & time 05/29/13  4098  First MD Initiated Contact with Patient 05/29/13 [phone]     Chief Complaint  Patient presents with  . Dental Pain   (Consider location/radiation/quality/duration/timing/severity/associated sxs/prior Treatment) Patient is a 22 y.o. female presenting with tooth pain. The history is provided by the patient. No language interpreter was used.  Dental Pain Location:  Upper Quality:  Pulsating, aching and constant Associated symptoms: no facial swelling and no fever   Associated symptoms comment:  Chronic but worsening dental pain at upper right rear molar. No facial swelling or fever.   History reviewed. No pertinent past medical history. History reviewed. No pertinent past surgical history. No family history on file. History  Substance Use Topics  . Smoking status: Never Smoker   . Smokeless tobacco: Not on file  . Alcohol Use: No   OB History   Grav Para Term Preterm Abortions TAB SAB Ect Mult Living   1 1 1       1      Review of Systems  Constitutional: Negative for fever and chills.  HENT: Positive for dental problem. Negative for facial swelling and trouble swallowing.   Gastrointestinal: Negative.     Allergies  Cyclinex and Minocycline  Home Medications   Current Outpatient Rx  Name  Route  Sig  Dispense  Refill  . acetaminophen (TYLENOL) 325 MG tablet   Oral   Take 650 mg by mouth every 6 (six) hours as needed. Pain         . HYDROcodone-acetaminophen (NORCO/VICODIN) 5-325 MG per tablet   Oral   Take 1 tablet by mouth every 4 (four) hours as needed for pain.   12 tablet   0   . ibuprofen (ADVIL,MOTRIN) 600 MG tablet   Oral   Take 1 tablet (600 mg total) by mouth every 6 (six) hours.   30 tablet   0   . Prenatal Vit-Fe Fumarate-FA (PRENATAL MULTIVITAMIN) TABS   Oral   Take 1 tablet by mouth daily at 12 noon.         . senna-docusate (SENOKOT-S) 8.6-50 MG per tablet   Oral   Take 2  tablets by mouth at bedtime.   60 tablet   0    BP 113/71  Pulse 85  Temp(Src) 98.6 F (37 C) (Oral)  Resp 16  SpO2 100%  LMP 05/23/2013 Physical Exam  Constitutional: She is oriented to person, place, and time. She appears well-developed and well-nourished.  HENT:  Generally good dentition. Mild swelling at partially erupted upper rear molar.   Neck: Normal range of motion.  Pulmonary/Chest: Effort normal.  Neurological: She is alert and oriented to person, place, and time.  Skin: Skin is warm and dry.    ED Course  Procedures (including critical care time) Labs Reviewed - No data to display No results found. 1. Pain, dental     MDM  No evidence to suggest abscess.   Arnoldo Hooker, PA-C 06/04/13 1551

## 2013-06-05 NOTE — ED Provider Notes (Signed)
Medical screening examination/treatment/procedure(s) were performed by non-physician practitioner and as supervising physician I was immediately available for consultation/collaboration.  Candyce Churn, MD 06/05/13 [phone]

## 2014-02-05 ENCOUNTER — Encounter (HOSPITAL_COMMUNITY): Payer: Self-pay | Admitting: Emergency Medicine

## 2014-02-05 ENCOUNTER — Emergency Department (HOSPITAL_COMMUNITY)
Admission: EM | Admit: 2014-02-05 | Discharge: 2014-02-06 | Disposition: A | Discharge status: Home / Self Care | Payer: Medicaid Other | Attending: Emergency Medicine | Admitting: Emergency Medicine

## 2014-02-05 DIAGNOSIS — R059 Cough, unspecified: Secondary | ICD-10-CM | POA: Insufficient documentation

## 2014-02-05 DIAGNOSIS — A088 Other specified intestinal infections: Secondary | ICD-10-CM | POA: Insufficient documentation

## 2014-02-05 DIAGNOSIS — A084 Viral intestinal infection, unspecified: Secondary | ICD-10-CM

## 2014-02-05 DIAGNOSIS — R05 Cough: Secondary | ICD-10-CM | POA: Insufficient documentation

## 2014-02-05 DIAGNOSIS — Z3202 Encounter for pregnancy test, result negative: Secondary | ICD-10-CM | POA: Insufficient documentation

## 2014-02-05 DIAGNOSIS — Z79899 Other long term (current) drug therapy: Secondary | ICD-10-CM | POA: Insufficient documentation

## 2014-02-05 NOTE — ED Notes (Signed)
Pt states she has had nausea, vomiting, and diarrhea since about 8pm tonight  Pt states she is having abd pain as well

## 2014-02-06 LAB — URINALYSIS, ROUTINE W REFLEX MICROSCOPIC
GLUCOSE, UA: NEGATIVE mg/dL
Hgb urine dipstick: NEGATIVE
KETONES UR: 40 mg/dL — AB
Leukocytes, UA: NEGATIVE
Nitrite: NEGATIVE
PH: 6 (ref 5.0–8.0)
Protein, ur: 30 mg/dL — AB
Specific Gravity, Urine: 1.03 (ref 1.005–1.030)
Urobilinogen, UA: 0.2 mg/dL (ref 0.0–1.0)

## 2014-02-06 LAB — CBC WITH DIFFERENTIAL/PLATELET
BASOS PCT: 0 % (ref 0–1)
Basophils Absolute: 0 10*3/uL (ref 0.0–0.1)
EOS ABS: 0 10*3/uL (ref 0.0–0.7)
Eosinophils Relative: 0 % (ref 0–5)
HEMATOCRIT: 39.9 % (ref 36.0–46.0)
HEMOGLOBIN: 13.5 g/dL (ref 12.0–15.0)
Lymphocytes Relative: 3 % — ABNORMAL LOW (ref 12–46)
Lymphs Abs: 0.4 10*3/uL — ABNORMAL LOW (ref 0.7–4.0)
MCH: 30.8 pg (ref 26.0–34.0)
MCHC: 33.8 g/dL (ref 30.0–36.0)
MCV: 91.1 fL (ref 78.0–100.0)
MONO ABS: 0.7 10*3/uL (ref 0.1–1.0)
MONOS PCT: 5 % (ref 3–12)
Neutro Abs: 11.8 10*3/uL — ABNORMAL HIGH (ref 1.7–7.7)
Neutrophils Relative %: 91 % — ABNORMAL HIGH (ref 43–77)
Platelets: 244 10*3/uL (ref 150–400)
RBC: 4.38 MIL/uL (ref 3.87–5.11)
RDW: 14.9 % (ref 11.5–15.5)
WBC: 13 10*3/uL — ABNORMAL HIGH (ref 4.0–10.5)

## 2014-02-06 LAB — COMPREHENSIVE METABOLIC PANEL
ALBUMIN: 4.4 g/dL (ref 3.5–5.2)
ALT: 15 U/L (ref 0–35)
AST: 18 U/L (ref 0–37)
Alkaline Phosphatase: 40 U/L (ref 39–117)
BILIRUBIN TOTAL: 0.9 mg/dL (ref 0.3–1.2)
BUN: 12 mg/dL (ref 6–23)
CO2: 22 mEq/L (ref 19–32)
CREATININE: 0.86 mg/dL (ref 0.50–1.10)
Calcium: 9.8 mg/dL (ref 8.4–10.5)
Chloride: 101 mEq/L (ref 96–112)
GFR calc Af Amer: >90 mL/min (ref >90)
GFR calc non Af Amer: >90 mL/min (ref >90)
Glucose, Bld: 115 mg/dL — ABNORMAL HIGH (ref 70–99)
Potassium: 3.6 mEq/L — ABNORMAL LOW (ref 3.7–5.3)
Sodium: 139 mEq/L (ref 137–147)
TOTAL PROTEIN: 7.2 g/dL (ref 6.0–8.3)

## 2014-02-06 LAB — URINE MICROSCOPIC-ADD ON

## 2014-02-06 LAB — POC URINE PREG, ED: PREG TEST UR: NEGATIVE

## 2014-02-06 MED ORDER — SODIUM CHLORIDE 0.9 % IV BOLUS (SEPSIS): 1000.0000 mL | Freq: Once | INTRAVENOUS | Status: DC

## 2014-02-06 MED ORDER — ONDANSETRON HCL 4 MG/2ML IJ SOLN
4.0000 mg | Freq: Once | INTRAMUSCULAR | Status: AC
  Administered 2014-02-06: 4 mg via INTRAVENOUS
  Filled 2014-02-06: qty 2

## 2014-02-06 MED ORDER — SODIUM CHLORIDE 0.9 % IV BOLUS (SEPSIS)
1000.0000 mL | Freq: Once | INTRAVENOUS | Status: AC
  Administered 2014-02-06: 1000 mL via INTRAVENOUS

## 2014-02-06 MED ORDER — PROMETHAZINE HCL 25 MG PO TABS: 25.0000 mg | ORAL_TABLET | Freq: Four times a day (QID) | ORAL | Status: DC | PRN

## 2014-02-06 MED ORDER — KETOROLAC TROMETHAMINE 30 MG/ML IJ SOLN
30.0000 mg | Freq: Once | INTRAMUSCULAR | Status: AC
  Administered 2014-02-06: 30 mg via INTRAVENOUS
  Filled 2014-02-06: qty 1

## 2014-02-06 MED ORDER — METOCLOPRAMIDE HCL 5 MG/ML IJ SOLN
10.0000 mg | Freq: Once | INTRAMUSCULAR | Status: AC
  Administered 2014-02-06: 10 mg via INTRAVENOUS
  Filled 2014-02-06: qty 2

## 2014-02-06 NOTE — ED Notes (Signed)
Patient asking for Sprite Patient informed of NPO status until seen by EPD

## 2014-02-06 NOTE — ED Provider Notes (Signed)
CSN: [id number]     Arrival date & time 02/05/14  2230 History   First MD Initiated Contact with Patient 02/06/14 0125     Chief Complaint  Patient presents with  . Emesis  . Diarrhea   HPI  History provided by the patient. Patient is a 23 year old female with no significant PMH presents with symptoms of nausea, vomiting and diarrhea areas patient states that she [place] 8 PM when she suddenly started to feel uneasy and nauseous. She returned home and had multiple episodes of vomiting which were later followed by multiple episodes of watery diarrhea. She denies any hematemesis or blood in stool. Her symptoms have been associated with some abdominal pain and cramping. She has been trying to drink fluids but states they go right through her. Denies any known sick contacts. No recent travel. She is not using any medications or treatment for symptoms. No other aggravating or alleviating factors. No associated fever, chills or sweats.   History reviewed. No pertinent past medical history. History reviewed. No pertinent past surgical history. Family History  Problem Relation Age of Onset  . Cancer Father    History  Substance Use Topics  . Smoking status: Never Smoker   . Smokeless tobacco: Not on file  . Alcohol Use: No   OB History   Grav Para Term Preterm Abortions TAB SAB Ect Mult Living   1 1 1       1      Review of Systems  Constitutional: Negative for fever, chills and diaphoresis.  HENT: Negative for congestion and rhinorrhea.   Respiratory: Positive for cough. Negative for shortness of breath.   Gastrointestinal: Positive for nausea, vomiting, abdominal pain and diarrhea.  All other systems reviewed and are negative.      Allergies  Cyclinex and Minocycline  Home Medications   Current Outpatient Rx  Name  Route  Sig  Dispense  Refill  . Norgestimate-Ethinyl Estradiol Triphasic (ORTHO TRI-CYCLEN, 28,) 0.18/0.215/0.25 MG-35 MCG  tablet   Oral   Take 1 tablet by mouth daily.         Marland Kitchen acetaminophen (TYLENOL) 325 MG tablet   Oral   Take 650 mg by mouth every 6 (six) hours as needed. Pain          BP 117/72  Pulse 92  Temp(Src) 97.9 F (36.6 C) (Oral)  Resp 18  Ht 5\' 5"  (1.651 m)  SpO2 100%  LMP 01/21/2014 Physical Exam  Nursing note and vitals reviewed. Constitutional: She is oriented to person, place, and time. She appears well-developed and well-nourished. No distress.  HENT:  Head: Normocephalic.  Cardiovascular: Normal rate and regular rhythm.   Pulmonary/Chest: Effort normal and breath sounds normal. No respiratory distress. She has no wheezes. She has no rales.  Abdominal: Soft. She exhibits no distension. There is tenderness. There is no rebound and no guarding.  Mild diffuse tenderness  Musculoskeletal: Normal range of motion.  Neurological: She is alert and oriented to person, place, and time.  Skin: Skin is warm and dry. No rash noted.  Psychiatric: She has a normal mood and affect. Her behavior is normal.    ED Course  Procedures   DIAGNOSTIC STUDIES: Oxygen Saturation is 100% on room air.    COORDINATION OF CARE:  Nursing notes reviewed. Vital signs reviewed. Initial pt interview and examination performed.   1:41 AM-patient seen and evaluated. Patient well-appearing in no acute distress. Does not appear severely ill or  toxic. Symptoms consistent with viral gastroenteritis. Lives with no significant concerning findings. Discussed continued treatment plan with patient which includes IV fluids and medications for pain and nausea symptoms. Patient agrees with plan.   Treatment plan initiated: Medications  sodium chloride 0.9 % bolus 1,000 mL (not administered)  ondansetron (ZOFRAN) injection 4 mg (not administered)  metoCLOPramide (REGLAN) injection 10 mg (not administered)  ketorolac (TORADOL) 30 MG/ML injection 30 mg (not administered)    Results for orders placed during the  hospital encounter of 02/05/14  URINALYSIS, ROUTINE W REFLEX MICROSCOPIC      Result Value Ref Range   Color, Urine YELLOW  YELLOW   APPearance CLOUDY (*) CLEAR   Specific Gravity, Urine 1.030  1.005 - 1.030   pH 6.0  5.0 - 8.0   Glucose, UA NEGATIVE  NEGATIVE mg/dL   Hgb urine dipstick NEGATIVE  NEGATIVE   Bilirubin Urine SMALL (*) NEGATIVE   Ketones, ur 40 (*) NEGATIVE mg/dL   Protein, ur 30 (*) NEGATIVE mg/dL   Urobilinogen, UA 0.2  0.0 - 1.0 mg/dL   Nitrite NEGATIVE  NEGATIVE   Leukocytes, UA NEGATIVE  NEGATIVE  CBC WITH DIFFERENTIAL      Result Value Ref Range   WBC 13.0 (*) 4.0 - 10.5 K/uL   RBC 4.38  3.87 - 5.11 MIL/uL   Hemoglobin 13.5  12.0 - 15.0 g/dL   HCT 40.939.9  81.136.0 - 91.446.0 %   MCV 91.1  78.0 - 100.0 fL   MCH 30.8  26.0 - 34.0 pg   MCHC 33.8  30.0 - 36.0 g/dL   RDW 78.214.9  95.611.5 - 21.315.5 %   Platelets 244  150 - 400 K/uL   Neutrophils Relative % 91 (*) 43 - 77 %   Neutro Abs 11.8 (*) 1.7 - 7.7 K/uL   Lymphocytes Relative 3 (*) 12 - 46 %   Lymphs Abs 0.4 (*) 0.7 - 4.0 K/uL   Monocytes Relative 5  3 - 12 %   Monocytes Absolute 0.7  0.1 - 1.0 K/uL   Eosinophils Relative 0  0 - 5 %   Eosinophils Absolute 0.0  0.0 - 0.7 K/uL   Basophils Relative 0  0 - 1 %   Basophils Absolute 0.0  0.0 - 0.1 K/uL  COMPREHENSIVE METABOLIC PANEL      Result Value Ref Range   Sodium 139  137 - 147 mEq/L   Potassium 3.6 (*) 3.7 - 5.3 mEq/L   Chloride 101  96 - 112 mEq/L   CO2 22  19 - 32 mEq/L   Glucose, Bld 115 (*) 70 - 99 mg/dL   BUN 12  6 - 23 mg/dL   Creatinine, Ser 0.860.86  0.50 - 1.10 mg/dL   Calcium 9.8  8.4 - 57.810.5 mg/dL   Total Protein 7.2  6.0 - 8.3 g/dL   Albumin 4.4  3.5 - 5.2 g/dL   AST 18  0 - 37 U/L   ALT 15  0 - 35 U/L   Alkaline Phosphatase 40  39 - 117 U/L   Total Bilirubin 0.9  0.3 - 1.2 mg/dL   GFR calc non Af Amer >90  >90 mL/min   GFR calc Af Amer >90  >90 mL/min  URINE MICROSCOPIC-ADD ON      Result Value Ref Range   Squamous Epithelial / LPF FEW (*) RARE    WBC, UA 0-2  <3 WBC/hpf   RBC / HPF 0-2  <3 RBC/hpf   Bacteria,  UA FEW (*) RARE   Urine-Other MUCOUS PRESENT    POC URINE PREG, ED      Result Value Ref Range   Preg Test, Ur NEGATIVE  NEGATIVE       MDM   Final diagnoses:  Viral gastroenteritis       Angus Seller, PA-C 02/06/14 0209

## 2014-02-06 NOTE — Discharge Instructions (Signed)
Continue to drink plenty of fluids to stay hydrated. Followup with a primary care provider for continued evaluation and treatment.    Viral Gastroenteritis Viral gastroenteritis is also known as stomach flu. This condition affects the stomach and intestinal tract. It can cause sudden diarrhea and vomiting. The illness typically lasts 3 to 8 days. Most people develop an immune response that eventually gets rid of the virus. While this natural response develops, the virus can make you quite ill. CAUSES  Many different viruses can cause gastroenteritis, such as rotavirus or noroviruses. You can catch one of these viruses by consuming contaminated food or water. You may also catch a virus by sharing utensils or other personal items with an infected person or by touching a contaminated surface. SYMPTOMS  The most common symptoms are diarrhea and vomiting. These problems can cause a severe loss of body fluids (dehydration) and a body salt (electrolyte) imbalance. Other symptoms may include:  Fever.  Headache.  Fatigue.  Abdominal pain. DIAGNOSIS  Your caregiver can usually diagnose viral gastroenteritis based on your symptoms and a physical exam. A stool sample may also be taken to test for the presence of viruses or other infections. TREATMENT  This illness typically goes away on its own. Treatments are aimed at rehydration. The most serious cases of viral gastroenteritis involve vomiting so severely that you are not able to keep fluids down. In these cases, fluids must be given through an intravenous line (IV). HOME CARE INSTRUCTIONS   Drink enough fluids to keep your urine clear or pale yellow. Drink small amounts of fluids frequently and increase the amounts as tolerated.  Ask your caregiver for specific rehydration instructions.  Avoid:  Foods high in sugar.  Alcohol.  Carbonated drinks.  Tobacco.  Juice.  Caffeine drinks.  Extremely hot or cold fluids.  Fatty, greasy  foods.  Too much intake of anything at one time.  Dairy products until 24 to 48 hours after diarrhea stops.  You may consume probiotics. Probiotics are active cultures of beneficial bacteria. They may lessen the amount and number of diarrheal stools in adults. Probiotics can be found in yogurt with active cultures and in supplements.  Wash your hands well to avoid spreading the virus.  Only take over-the-counter or prescription medicines for pain, discomfort, or fever as directed by your caregiver. Do not give aspirin to children. Antidiarrheal medicines are not recommended.  Ask your caregiver if you should continue to take your regular prescribed and over-the-counter medicines.  Keep all follow-up appointments as directed by your caregiver. SEEK IMMEDIATE MEDICAL CARE IF:   You are unable to keep fluids down.  You do not urinate at least once every 6 to 8 hours.  You develop shortness of breath.  You notice blood in your stool or vomit. This may look like coffee grounds.  You have abdominal pain that increases or is concentrated in one small area (localized).  You have persistent vomiting or diarrhea.  You have a fever.  The patient is a child younger than 3 months, and he or she has a fever.  The patient is a child older than 3 months, and he or she has a fever and persistent symptoms.  The patient is a child older than 3 months, and he or she has a fever and symptoms suddenly get worse.  The patient is a baby, and he or she has no tears when crying. MAKE SURE YOU:   Understand these instructions.  Will watch your condition.  Will get help right away if you are not doing well or get worse. Document Released: 11/02/2005 Document Revised: 01/25/2012 Document Reviewed: 08/19/2011 [facility] Patient Information 05-06-13 [place].

## 2014-02-07 NOTE — ED Provider Notes (Signed)
Medical screening examination/treatment/procedure(s) were performed by non-physician practitioner and as supervising physician I was immediately available for consultation/collaboration.   EKG Interpretation None        Julie Manly, MD 02/07/14 0618 

## 2014-05-10 ENCOUNTER — Emergency Department (HOSPITAL_COMMUNITY)
Admission: EM | Admit: 2014-05-10 | Discharge: 2014-05-11 | Disposition: A | Discharge status: Home / Self Care | Payer: Medicaid Other | Attending: Emergency Medicine | Admitting: Emergency Medicine

## 2014-05-10 ENCOUNTER — Encounter (HOSPITAL_COMMUNITY): Payer: Self-pay | Admitting: Emergency Medicine

## 2014-05-10 ENCOUNTER — Emergency Department (HOSPITAL_COMMUNITY): Payer: Medicaid Other

## 2014-05-10 DIAGNOSIS — IMO0002 Reserved for concepts with insufficient information to code with codable children: Secondary | ICD-10-CM | POA: Insufficient documentation

## 2014-05-10 DIAGNOSIS — Z79899 Other long term (current) drug therapy: Secondary | ICD-10-CM | POA: Insufficient documentation

## 2014-05-10 DIAGNOSIS — M722 Plantar fascial fibromatosis: Secondary | ICD-10-CM | POA: Insufficient documentation

## 2014-05-10 NOTE — ED Notes (Signed)
Pt states she went to a concert on Saturday and was dancing and stomping around  Pt states she thought she bruised her right heel and thought it would get better  Pt states instead the pain has gotten worse and spread up into her ankle  Pt states she is unable to walk on that foot without shoes on and has to walk on the side of her foot  Pt states she has been using ibuprofen, ice, rest, and elevation but it is not helping

## 2014-05-10 NOTE — ED Provider Notes (Signed)
CSN: [id number]     Arrival date & time 05/10/14  2124 History   None    This chart was scribed for non-physician practitioner, Ivonne AndrewPeter Dammen PA-C, working with Hurman HornJohn M Bednar, MD by Arlan OrganAshley Leger, ED Scribe. This patient was seen in room WTR8/WTR8 and the patient's care was started at 10:33 PM.   Chief Complaint  Patient presents with  . Ankle Pain   HPI  HPI Comments: Cathy [name] is a 23 y.o. female who presents to the Emergency Department complaining of constant, moderate R ankle pain x 5 days that has progressively worsened. Pt states she attended a Metal concert Saturday and recalls dancing around for extended periods of time and attributes this to her pain. Pt states she is unable to bear weight to her R foot or heel at this time secondary to pain. Pain is especially worsened when she first wakes up in the morning to walk. She states symptoms are improved when rubbing the area. She has tried OTC Ibuprofen, ice, rest, and elevation without any improvement. Last dose around noon today. At this time she denies any fever, chills, numbness, loss of sensation, or paresthesia. She reports a known allergy to minocycline and Cyclinex. Pt is otherwise healthy without any medical problems. No other concerns this visit.  History reviewed. No pertinent past medical history. Past Surgical History  Procedure Laterality Date  . Extraction of wisdom teeth     Family History  Problem Relation Age of Onset  . Cancer Father    History  Substance Use Topics  . Smoking status: Never Smoker   . Smokeless tobacco: Not on file  . Alcohol Use: Yes     Comment: occ   OB History   Grav Para Term Preterm Abortions TAB SAB Ect Mult Living   1 1 1       1      Review of Systems  Constitutional: Negative for fever and chills.  HENT: Negative for congestion.   Eyes: Negative for redness.  Respiratory: Negative for cough.   Musculoskeletal: Positive for arthralgias (R ankle).  Skin: Negative for rash.   Neurological: Negative for numbness.  Psychiatric/Behavioral: Negative for confusion.      Allergies  Cyclinex and Minocycline  Home Medications   Prior to Admission medications   Medication Sig Start Date End Date Taking? Authorizing Provider  acetaminophen (TYLENOL) 325 MG tablet Take 650 mg by mouth every 6 (six) hours as needed. Pain    Historical Provider, MD  Norgestimate-Ethinyl Estradiol Triphasic (ORTHO TRI-CYCLEN, 28,) 0.18/0.215/0.25 MG-35 MCG tablet Take 1 tablet by mouth daily.    Historical Provider, MD  promethazine (PHENERGAN) 25 MG tablet Take 1 tablet (25 mg total) by mouth every 6 (six) hours as needed for nausea. 02/06/14   Angus SellerPeter S Dammen, PA-C   Triage Vitals: BP 122/76  Pulse 79  Temp(Src) 98.6 F (37 C) (Oral)  Resp 14  SpO2 99%  LMP 05/08/2014   Physical Exam  Nursing note and vitals reviewed. Constitutional: She is oriented to person, place, and time. She appears well-developed and well-nourished.  HENT:  Head: Normocephalic.  Eyes: Conjunctivae are normal.  Neck: Normal range of motion.  Pulmonary/Chest: Effort normal.  Musculoskeletal: Normal range of motion.  There is tenderness around the right heel and base of the ankle on the right foot. Normal Thompson test no significant pain along the Achilles tendon. No gross deformities. Mild swelling to the lateral and posterior aspects of the ankle and heel. Normal dorsal pedal  pulses and sensation in toes.  Neurological: She is alert and oriented to person, place, and time.  Psychiatric: She has a normal mood and affect.    ED Course  Procedures   DIAGNOSTIC STUDIES: Oxygen Saturation is 99% on RA, Normal by my interpretation.    COORDINATION OF CARE: 10:41 PM- Will order X-Rays. Discussed treatment plan with pt at bedside and pt agreed to plan.      MDM   Final diagnoses:  Plantar fasciitis of right foot      I personally performed the services described in this documentation, which was  scribed in my presence. The recorded information has been reviewed and is accurate.    Angus SellerPeter S Dammen, PA-C 05/12/14 [phone]

## 2014-05-11 MED ORDER — MELOXICAM 15 MG PO TABS: 15.0000 mg | ORAL_TABLET | Freq: Every day | ORAL | Status: DC

## 2014-05-11 NOTE — Discharge Instructions (Signed)
Your x-ray today did not show any broken bones or other concerning injuries for your pain. At this time your providers feel that your symptoms are caused from inflammation around your heel and the foot. Use rest, ice, compression elevation and reduce pain. Followup with a primary care provider.   Plantar Fasciitis Plantar fasciitis is a common condition that causes foot pain. It is soreness (inflammation) of the band of tough fibrous tissue on the bottom of the foot that runs from the heel bone (calcaneus) to the ball of the foot. The cause of this soreness may be from excessive standing, poor fitting shoes, running on hard surfaces, being overweight, having an abnormal walk, or overuse (this is common in runners) of the painful foot or feet. It is also common in aerobic exercise dancers and ballet dancers. SYMPTOMS  Most people with plantar fasciitis complain of:  Severe pain in the morning on the bottom of their foot especially when taking the first steps out of bed. This pain recedes after a few minutes of walking.  Severe pain is experienced also during walking following a long period of inactivity.  Pain is worse when walking barefoot or up stairs DIAGNOSIS   Your caregiver will diagnose this condition by examining and feeling your foot.  Special tests such as X-rays of your foot, are usually not needed. PREVENTION   Consult a sports medicine professional before beginning a new exercise program.  Walking programs offer a good workout. With walking there is a lower chance of overuse injuries common to runners. There is less impact and less jarring of the joints.  Begin all new exercise programs slowly. If problems or pain develop, decrease the amount of time or distance until you are at a comfortable level.  Wear good shoes and replace them regularly.  Stretch your foot and the heel cords at the back of the ankle (Achilles tendon) both before and after exercise.  Run or exercise on  even surfaces that are not hard. For example, asphalt is better than pavement.  Do not run barefoot on hard surfaces.  If using a treadmill, vary the incline.  Do not continue to workout if you have foot or joint problems. Seek professional help if they do not improve. HOME CARE INSTRUCTIONS   Avoid activities that cause you pain until you recover.  Use ice or cold packs on the problem or painful areas after working out.  Only take over-the-counter or prescription medicines for pain, discomfort, or fever as directed by your caregiver.  Soft shoe inserts or athletic shoes with air or gel sole cushions may be helpful.  If problems continue or become more severe, consult a sports medicine caregiver or your own health care provider. Cortisone is a potent anti-inflammatory medication that may be injected into the painful area. You can discuss this treatment with your caregiver. MAKE SURE YOU:   Understand these instructions.  Will watch your condition.  Will get help right away if you are not doing well or get worse. Document Released: 07/28/2001 Document Revised: 01/25/2012 Document Reviewed: 09/26/2008 [facility] Patient Information 2015 [place], [place]. This information is not intended to replace advice given to you by your health care provider. Make sure you discuss any questions you have with your health care provider.

## 2014-05-13 NOTE — ED Provider Notes (Signed)
Medical screening examination/treatment/procedure(s) were performed by non-physician practitioner and as supervising physician I was immediately available for consultation/collaboration.   EKG Interpretation None       John M Bednar, MD 05/13/14 2105 

## 2014-09-17 ENCOUNTER — Encounter (HOSPITAL_COMMUNITY): Payer: Self-pay | Admitting: Emergency Medicine

## 2014-12-23 ENCOUNTER — Emergency Department (INDEPENDENT_AMBULATORY_CARE_PROVIDER_SITE_OTHER)
Admission: EM | Admit: 2014-12-23 | Discharge: 2014-12-23 | Disposition: A | Discharge status: Home / Self Care | Payer: Self-pay | Source: Home / Self Care | Attending: Family Medicine | Admitting: Family Medicine

## 2014-12-23 ENCOUNTER — Encounter (HOSPITAL_COMMUNITY): Payer: Self-pay | Admitting: Emergency Medicine

## 2014-12-23 DIAGNOSIS — J0140 Acute pansinusitis, unspecified: Secondary | ICD-10-CM

## 2014-12-23 MED ORDER — AMOXICILLIN-POT CLAVULANATE 875-125 MG PO TABS: 1.0000 | ORAL_TABLET | Freq: Two times a day (BID) | ORAL | Status: DC

## 2014-12-23 MED ORDER — IBUPROFEN 600 MG PO TABS: 600.0000 mg | ORAL_TABLET | Freq: Four times a day (QID) | ORAL | Status: DC | PRN

## 2014-12-23 MED ORDER — FLUCONAZOLE 150 MG PO TABS: 150.0000 mg | ORAL_TABLET | Freq: Every day | ORAL | Status: DC

## 2014-12-23 MED ORDER — IPRATROPIUM BROMIDE 0.06 % NA SOLN: 2.0000 | Freq: Four times a day (QID) | NASAL | Status: DC

## 2014-12-23 NOTE — Discharge Instructions (Signed)
You have a sinus infection that will require antibiotics in order to clear. Please consider using the Motrin for pain and swelling relief. Please consider using the Atrovent for nasal congestion. Use the Diflucan if you develop a yeast infection.

## 2014-12-23 NOTE — ED Notes (Signed)
Initially had a sore throat.  Now has a cough, congestion, top teeth sore and head hurts

## 2014-12-23 NOTE — ED Provider Notes (Signed)
CSN: [id number]     Arrival date & time 12/23/14  1608 History   First MD Initiated Contact with Patient 12/23/14 1650     Chief Complaint  Patient presents with  . URI   (Consider location/radiation/quality/duration/timing/severity/associated sxs/prior Treatment) HPI  Developed cold symptoms 7 days ago. Runny nose and sore throat. Now w/ coughing. Initially improving and now w/ acute onset of facial pain and sinus congestion. L ear pain. Cough drops w/o benefit. Getting worse. Subjective fevers. Denies SOB, CP, palpitations, rash, syncope.    History reviewed. No pertinent past medical history. Past Surgical History  Procedure Laterality Date  . Extraction of wisdom teeth     Family History  Problem Relation Age of Onset  . Cancer Father     lung   History  Substance Use Topics  . Smoking status: Never Smoker   . Smokeless tobacco: Not on file  . Alcohol Use: Yes     Comment: occ   OB History    Gravida Para Term Preterm AB TAB SAB Ectopic Multiple Living   1 1 1       1      Review of Systems Per HPI with all other pertinent systems negative.   Allergies  Cyclinex and Minocycline  Home Medications   Prior to Admission medications   Medication Sig Start Date End Date Taking? Authorizing Provider  amoxicillin-clavulanate (AUGMENTIN) 875-125 MG per tablet Take 1 tablet by mouth 2 (two) times daily. 12/23/14   Ozella Rocksavid J Merit Gadsby, MD  fluconazole (DIFLUCAN) 150 MG tablet Take 1 tablet (150 mg total) by mouth daily. Repeat dose in 3 days 12/23/14   Ozella Rocksavid J Handy Mcloud, MD  ibuprofen (ADVIL,MOTRIN) 600 MG tablet Take 1 tablet (600 mg total) by mouth every 6 (six) hours as needed. 12/23/14   Ozella Rocksavid J Arlisa Leclere, MD  ipratropium (ATROVENT) 0.06 % nasal spray Place 2 sprays into both nostrils 4 (four) times daily. 12/23/14   Ozella Rocksavid J Jayne Peckenpaugh, MD  meloxicam (MOBIC) 15 MG tablet Take 1 tablet (15 mg total) by mouth daily. 05/11/14   Angus SellerPeter S Dammen, PA-C  Norgestimate-Ethinyl Estradiol Triphasic  (ORTHO TRI-CYCLEN, 28,) 0.18/0.215/0.25 MG-35 MCG tablet Take 1 tablet by mouth daily.    Historical Provider, MD   BP 127/90 mmHg  Pulse 110  Temp(Src) 98.8 F (37.1 C) (Oral)  Resp 18  SpO2 99%  LMP 12/23/2014 Physical Exam  Constitutional: She is oriented to person, place, and time. She appears well-developed and well-nourished. No distress.  HENT:  Maxillary sinuses tender to palpation.  Eyes: EOM are normal. Pupils are equal, round, and reactive to light.  Neck: Normal range of motion.  Cardiovascular: Normal rate, normal heart sounds and intact distal pulses.   No murmur heard. Pulmonary/Chest: Effort normal and breath sounds normal.  Abdominal: Soft. She exhibits no distension.  Musculoskeletal: She exhibits no tenderness.  Neurological: She is alert and oriented to person, place, and time.  Skin: Skin is warm. She is not diaphoretic.  Psychiatric: She has a normal mood and affect. Her behavior is normal.    ED Course  Procedures (including critical care time) Labs Review Labs Reviewed - No data to display  Imaging Review No results found.   MDM   1. Acute pansinusitis, recurrence not specified    Frontal and maxillary sinusitis. Maxillary significantly worse than frontal. Start Augmentin. Nasal Atrovent when necessary for continued runny nose. Motrin 600 for pain and inflammation. Diflucan when necessary if develops yeast infection. Precautions given and all questions  answered  Shelly Flatten, MD Family Medicine 12/23/2014, 5:35 PM      Ozella Rocks, MD 12/23/14 [phone]

## 2015-01-20 ENCOUNTER — Emergency Department (INDEPENDENT_AMBULATORY_CARE_PROVIDER_SITE_OTHER)
Admission: EM | Admit: 2015-01-20 | Discharge: 2015-01-20 | Disposition: A | Discharge status: Home / Self Care | Payer: Medicaid Other | Source: Home / Self Care | Attending: Family Medicine | Admitting: Family Medicine

## 2015-01-20 ENCOUNTER — Encounter (HOSPITAL_COMMUNITY): Payer: Self-pay

## 2015-01-20 DIAGNOSIS — M67431 Ganglion, right wrist: Secondary | ICD-10-CM | POA: Diagnosis not present

## 2015-01-20 NOTE — ED Provider Notes (Signed)
CSN: [id number]     Arrival date & time 01/20/15  1815 History   First MD Initiated Contact with Patient 01/20/15 1829     Chief Complaint  Patient presents with  . Wrist Pain   (Consider location/radiation/quality/duration/timing/severity/associated sxs/prior Treatment) Patient is a 24 y.o. female presenting with wrist pain. The history is provided by the patient.  Wrist Pain This is a recurrent problem. The current episode started yesterday (h/o prior eval and told it was a cyst, now desires removal.). The problem has not changed since onset.   History reviewed. No pertinent past medical history. Past Surgical History  Procedure Laterality Date  . Extraction of wisdom teeth     Family History  Problem Relation Age of Onset  . Cancer Father     lung   History  Substance Use Topics  . Smoking status: Never Smoker   . Smokeless tobacco: Not on file  . Alcohol Use: Yes     Comment: occ   OB History    Gravida Para Term Preterm AB TAB SAB Ectopic Multiple Living   1 1 1       1      Review of Systems  Constitutional: Negative.   Musculoskeletal: Positive for joint swelling.  Skin: Negative.     Allergies  Cyclinex and Minocycline  Home Medications   Prior to Admission medications   Medication Sig Start Date End Date Taking? Authorizing Provider  amoxicillin-clavulanate (AUGMENTIN) 875-125 MG per tablet Take 1 tablet by mouth 2 (two) times daily. 12/23/14   Ozella Rocksavid J Merrell, MD  fluconazole (DIFLUCAN) 150 MG tablet Take 1 tablet (150 mg total) by mouth daily. Repeat dose in 3 days 12/23/14   Ozella Rocksavid J Merrell, MD  ibuprofen (ADVIL,MOTRIN) 600 MG tablet Take 1 tablet (600 mg total) by mouth every 6 (six) hours as needed. 12/23/14   Ozella Rocksavid J Merrell, MD  ipratropium (ATROVENT) 0.06 % nasal spray Place 2 sprays into both nostrils 4 (four) times daily. 12/23/14   Ozella Rocksavid J Merrell, MD  meloxicam (MOBIC) 15 MG tablet Take 1 tablet (15 mg total) by mouth daily. 05/11/14   Angus SellerPeter S Dammen,  PA-C  Norgestimate-Ethinyl Estradiol Triphasic (ORTHO TRI-CYCLEN, 28,) 0.18/0.215/0.25 MG-35 MCG tablet Take 1 tablet by mouth daily.    Historical Provider, MD   BP 113/70 mmHg  Temp(Src) 99 F (37.2 C) (Oral)  Resp 12  SpO2 99%  LMP 12/23/2014 Physical Exam  Constitutional: She is oriented to person, place, and time. She appears well-developed and well-nourished.  Musculoskeletal: She exhibits tenderness.  5mm cystic swelling to mid dorsum of right wrist, no erythema, distal nv fxn nl., soft mobile.  Neurological: She is alert and oriented to person, place, and time.  Skin: Skin is warm and dry.  Nursing note and vitals reviewed.   ED Course  Procedures (including critical care time) Labs Review Labs Reviewed - No data to display  Imaging Review No results found.   MDM   1. Ganglion cyst of wrist, right        Linna HoffJames D Suman Trivedi, MD 01/20/15 [phone]

## 2015-01-20 NOTE — ED Notes (Signed)
Has a recurrent lump on her dominant (right ) wrist . Playing w her dog, hyper flexion type injury and has a young child to lift

## 2015-01-20 NOTE — Discharge Instructions (Signed)
Wear splint and use ice and advil as needed, see orthopedist if removal is desired.

## 2015-08-23 ENCOUNTER — Encounter (HOSPITAL_COMMUNITY): Payer: Self-pay

## 2015-08-23 ENCOUNTER — Emergency Department (HOSPITAL_COMMUNITY)
Admission: EM | Admit: 2015-08-23 | Discharge: 2015-08-23 | Disposition: A | Discharge status: Home / Self Care | Payer: Medicaid Other | Attending: Physician Assistant | Admitting: Physician Assistant

## 2015-08-23 DIAGNOSIS — Z3A11 11 weeks gestation of pregnancy: Secondary | ICD-10-CM | POA: Diagnosis not present

## 2015-08-23 DIAGNOSIS — R112 Nausea with vomiting, unspecified: Secondary | ICD-10-CM

## 2015-08-23 DIAGNOSIS — O210 Mild hyperemesis gravidarum: Secondary | ICD-10-CM | POA: Insufficient documentation

## 2015-08-23 DIAGNOSIS — Z79899 Other long term (current) drug therapy: Secondary | ICD-10-CM | POA: Insufficient documentation

## 2015-08-23 DIAGNOSIS — Z791 Long term (current) use of non-steroidal anti-inflammatories (NSAID): Secondary | ICD-10-CM | POA: Insufficient documentation

## 2015-08-23 LAB — CBC WITH DIFFERENTIAL/PLATELET
BASOS PCT: 0 %
Basophils Absolute: 0 10*3/uL (ref 0.0–0.1)
Eosinophils Absolute: 0.1 10*3/uL (ref 0.0–0.7)
Eosinophils Relative: 1 %
HEMATOCRIT: 37.6 % (ref 36.0–46.0)
HEMOGLOBIN: 12.8 g/dL (ref 12.0–15.0)
LYMPHS ABS: 1.4 10*3/uL (ref 0.7–4.0)
Lymphocytes Relative: 14 %
MCH: 32.2 pg (ref 26.0–34.0)
MCHC: 34 g/dL (ref 30.0–36.0)
MCV: 94.5 fL (ref 78.0–100.0)
MONOS PCT: 7 %
Monocytes Absolute: 0.7 10*3/uL (ref 0.1–1.0)
NEUTROS ABS: 8.3 10*3/uL — AB (ref 1.7–7.7)
NEUTROS PCT: 78 %
Platelets: 251 10*3/uL (ref 150–400)
RBC: 3.98 MIL/uL (ref 3.87–5.11)
RDW: 13 % (ref 11.5–15.5)
WBC: 10.6 10*3/uL — ABNORMAL HIGH (ref 4.0–10.5)

## 2015-08-23 LAB — COMPREHENSIVE METABOLIC PANEL
ALBUMIN: 3.9 g/dL (ref 3.5–5.0)
ALK PHOS: 32 U/L — AB (ref 38–126)
ALT: 10 U/L — AB (ref 14–54)
ANION GAP: 7 (ref 5–15)
AST: 16 U/L (ref 15–41)
BILIRUBIN TOTAL: 0.9 mg/dL (ref 0.3–1.2)
BUN: 6 mg/dL (ref 6–20)
CALCIUM: 9.4 mg/dL (ref 8.9–10.3)
CO2: 24 mmol/L (ref 22–32)
CREATININE: 0.54 mg/dL (ref 0.44–1.00)
Chloride: 108 mmol/L (ref 101–111)
GFR calc Af Amer: >60 mL/min (ref >60)
GFR calc non Af Amer: >60 mL/min (ref >60)
GLUCOSE: 94 mg/dL (ref 65–99)
Potassium: 3.5 mmol/L (ref 3.5–5.1)
Sodium: 139 mmol/L (ref 135–145)
TOTAL PROTEIN: 6.9 g/dL (ref 6.5–8.1)

## 2015-08-23 MED ORDER — SODIUM CHLORIDE 0.9 % IV BOLUS (SEPSIS)
1000.0000 mL | Freq: Once | INTRAVENOUS | Status: AC
  Administered 2015-08-23: 1000 mL via INTRAVENOUS

## 2015-08-23 MED ORDER — ONDANSETRON HCL 4 MG/2ML IJ SOLN
4.0000 mg | Freq: Once | INTRAMUSCULAR | Status: AC
  Administered 2015-08-23: 4 mg via INTRAVENOUS
  Filled 2015-08-23: qty 2

## 2015-08-23 MED ORDER — ONDANSETRON HCL 4 MG PO TABS: 4.0000 mg | ORAL_TABLET | Freq: Three times a day (TID) | ORAL | Status: DC | PRN

## 2015-08-23 NOTE — ED Notes (Signed)
Discharge instructions and rx x1 discussed with patient. Patient verbalized understanding. 

## 2015-08-23 NOTE — ED Provider Notes (Signed)
CSN: [id number]     Arrival date & time 08/23/15  8295 History   First MD Initiated Contact with Patient 08/23/15 0720     Chief Complaint  Patient presents with  . Morning Sickness     (Consider location/radiation/quality/duration/timing/severity/associated sxs/prior Treatment) HPI   Patient is a very pleasant 24 year old female G2 P1 presenting with nausea and vomiting of pregnancy. Patient sees an OB in [place] but is transferring her care OB here in [place].  Patient is 11 weeks and has already seen an OB. She is reporting nausea and vomiting. She had to come several times the emergency room during her last pregnancy for fluid. She is here because she feels dehydrated. She's had mostly dry heaving and nausea.   History reviewed. No pertinent past medical history. Past Surgical History  Procedure Laterality Date  . Extraction of wisdom teeth     Family History  Problem Relation Age of Onset  . Cancer Father     lung   Social History  Substance Use Topics  . Smoking status: Never Smoker   . Smokeless tobacco: None  . Alcohol Use: Yes     Comment: occ   OB History    Gravida Para Term Preterm AB TAB SAB Ectopic Multiple Living   Review of Systems  Constitutional: Negative for activity change and fatigue.  HENT: Negative for congestion and drooling.   Eyes: Negative for discharge.  Respiratory: Negative for cough and chest tightness.   Cardiovascular: Negative for chest pain.  Gastrointestinal: Positive for nausea and vomiting. Negative for abdominal pain and abdominal distention.  Genitourinary: Negative for dysuria, hematuria, vaginal bleeding, difficulty urinating, menstrual problem and pelvic pain.  Musculoskeletal: Negative for joint swelling.  Skin: Negative for rash.  Allergic/Immunologic: Negative for immunocompromised state.  Psychiatric/Behavioral: Negative for behavioral problems and agitation.      Allergies  Cyclinex and  Minocycline  Home Medications   Prior to Admission medications   Medication Sig Start Date End Date Taking? Authorizing Provider  amoxicillin-clavulanate (AUGMENTIN) 875-125 MG per tablet Take 1 tablet by mouth 2 (two) times daily. 12/23/14   Ozella Rocks, MD  fluconazole (DIFLUCAN) 150 MG tablet Take 1 tablet (150 mg total) by mouth daily. Repeat dose in 3 days 12/23/14   Ozella Rocks, MD  ibuprofen (ADVIL,MOTRIN) 600 MG tablet Take 1 tablet (600 mg total) by mouth every 6 (six) hours as needed. 12/23/14   Ozella Rocks, MD  ipratropium (ATROVENT) 0.06 % nasal spray Place 2 sprays into both nostrils 4 (four) times daily. 12/23/14   Ozella Rocks, MD  meloxicam (MOBIC) 15 MG tablet Take 1 tablet (15 mg total) by mouth daily. 05/11/14   Ivonne Andrew, PA-C  Norgestimate-Ethinyl Estradiol Triphasic (ORTHO TRI-CYCLEN, 28,) 0.18/0.215/0.25 MG-35 MCG tablet Take 1 tablet by mouth daily.    Historical Provider, MD   BP 107/64 mmHg  Pulse 86  Temp(Src) 98.2 F (36.8 C) (Oral)  Resp 20  SpO2 99%  LMP 12/23/2014 Physical Exam  Constitutional: She is oriented to person, place, and time. She appears well-developed and well-nourished.  HENT:  Head: Normocephalic and atraumatic.  Dry mucous membranes  Eyes: Conjunctivae are normal. Right eye exhibits no discharge.  Neck: Neck supple.  Cardiovascular: Normal rate, regular rhythm and normal heart sounds.   No murmur heard. Pulmonary/Chest: Effort normal and breath sounds normal. She has no wheezes. She has no rales.  Abdominal: Soft. She exhibits no distension. There is no tenderness.  Musculoskeletal: Normal range of motion. She exhibits no edema.  Neurological: She is oriented to person, place, and time. No cranial nerve deficit.  Skin: Skin is warm and dry. No rash noted. She is not diaphoretic.  Psychiatric: She has a normal mood and affect. Her behavior is normal.  Nursing note and vitals reviewed.   ED Course  Procedures (including  critical care time) Labs Review Labs Reviewed  CBC WITH DIFFERENTIAL/PLATELET  COMPREHENSIVE METABOLIC PANEL    Imaging Review No results found. I have personally reviewed and evaluated these images and lab results as part of my medical decision-making.   EKG Interpretation None      MDM   Final diagnoses:  None   patient's age 24 year old female G2 P1 presenting with nausea and vomiting of pregnancy at 11 weeks. Patient had mostly dry heaving and nausea. We will give a liter fluid check for electrolytes. Plan to discharge home with B6, Unisom and Zofran.  Since had no complications of pregnancy, no bleeding no pain or cramping.  Skylor Schnapp Randall An, MD 08/23/15 [phone]

## 2015-08-23 NOTE — ED Notes (Signed)
Pt is [redacted] weeks pregnant and states that she is very sick and has been vomiting for two days ans she feels very dehydrated

## 2015-08-23 NOTE — Discharge Instructions (Signed)
You should try Unisom and B6 daily to help with your nausea.  Please follow up with your OB this week.  Nausea and Vomiting Nausea means you feel sick to your stomach. Throwing up (vomiting) is a reflex where stomach contents come out of your mouth. HOME CARE   Take medicine as told by your doctor.  Do not force yourself to eat. However, you do need to drink fluids.  If you feel like eating, eat a normal diet as told by your doctor.  Eat rice, wheat, potatoes, bread, lean meats, yogurt, fruits, and vegetables.  Avoid high-fat foods.  Drink enough fluids to keep your pee (urine) clear or pale yellow.  Ask your doctor how to replace body fluid losses (rehydrate). Signs of body fluid loss (dehydration) include:  Feeling very thirsty.  Dry lips and mouth.  Feeling dizzy.  Dark pee.  Peeing less than normal.  Feeling confused.  Fast breathing or heart rate. GET HELP RIGHT AWAY IF:   You have blood in your throw up.  You have black or bloody poop (stool).  You have a bad headache or stiff neck.  You feel confused.  You have bad belly (abdominal) pain.  You have chest pain or trouble breathing.  You do not pee at least once every 8 hours.  You have cold, clammy skin.  You keep throwing up after 24 to 48 hours.  You have a fever. MAKE SURE YOU:   Understand these instructions.  Will watch your condition.  Will get help right away if you are not doing well or get worse.   This information is not intended to replace advice given to you by your health care provider. Make sure you discuss any questions you have with your health care provider.   Document Released: 04/20/2008 Document Revised: 01/25/2012 Document Reviewed: 04/03/2011 Elsevier Interactive Patient Education [employer]

## 2015-10-03 LAB — OB RESULTS CONSOLE ANTIBODY SCREEN: Antibody Screen: NEGATIVE

## 2015-10-03 LAB — OB RESULTS CONSOLE RUBELLA ANTIBODY, IGM: RUBELLA: IMMUNE

## 2015-10-03 LAB — OB RESULTS CONSOLE ABO/RH: RH TYPE: POSITIVE

## 2015-10-03 LAB — OB RESULTS CONSOLE GC/CHLAMYDIA
CHLAMYDIA, DNA PROBE: NEGATIVE
Gonorrhea: NEGATIVE

## 2015-10-03 LAB — OB RESULTS CONSOLE HEPATITIS B SURFACE ANTIGEN: Hepatitis B Surface Ag: NEGATIVE

## 2015-10-03 LAB — OB RESULTS CONSOLE RPR: RPR: NONREACTIVE

## 2015-10-03 LAB — OB RESULTS CONSOLE HIV ANTIBODY (ROUTINE TESTING): HIV: NONREACTIVE

## 2015-10-30 IMAGING — CR DG FOOT COMPLETE 3+V*R*
3 series · 3 of 3 positions shown · non-contrast
Comparison: None.

CLINICAL DATA: Right foot pain along the healed.  Stomping injury.

EXAM:
RIGHT FOOT COMPLETE - 3+ VIEW

[x foot ap right]
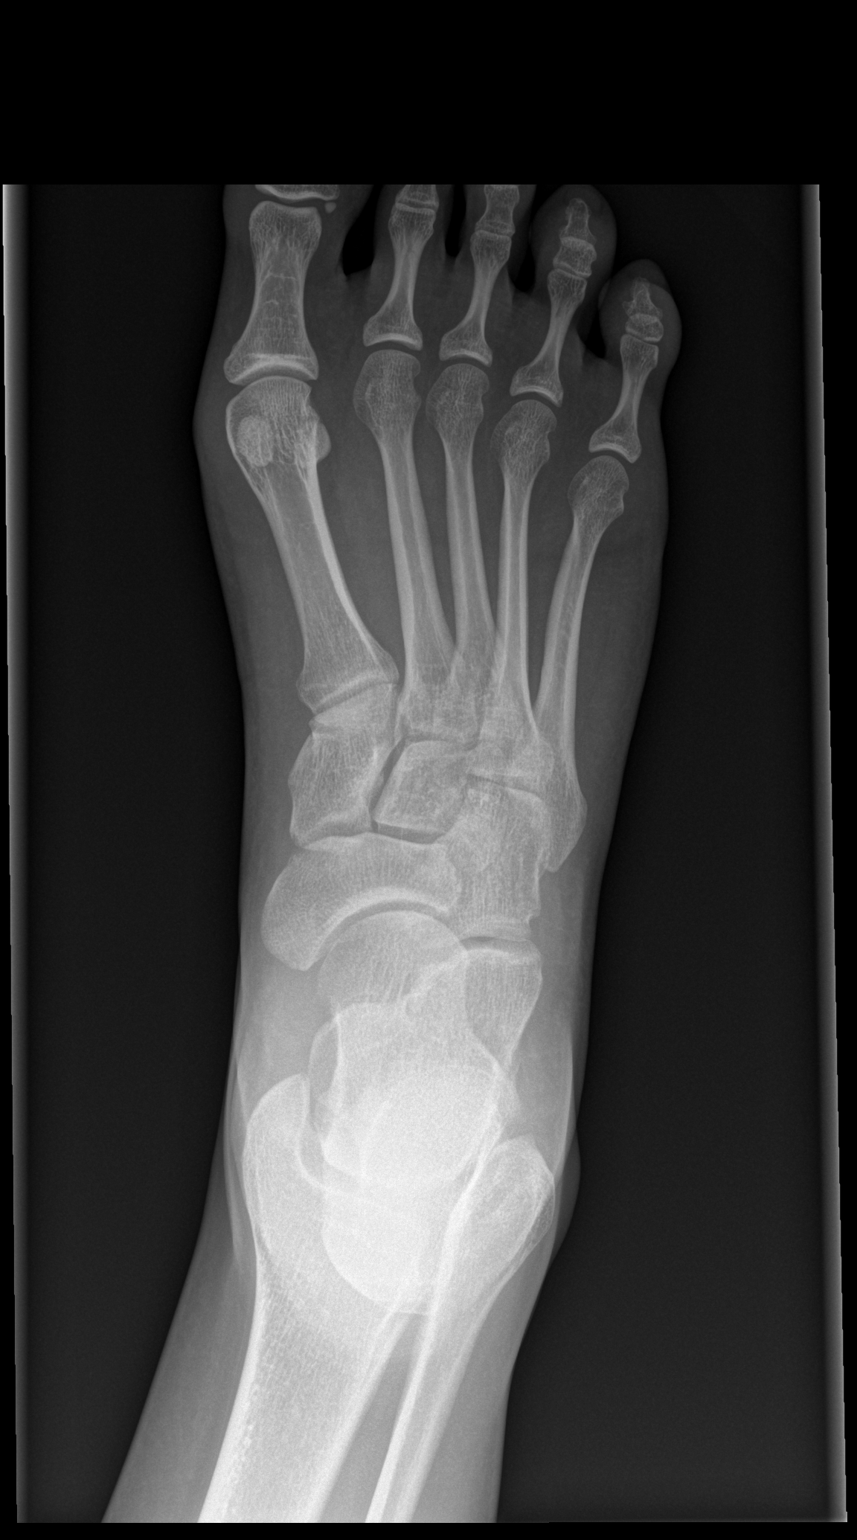

[x foot obl right]
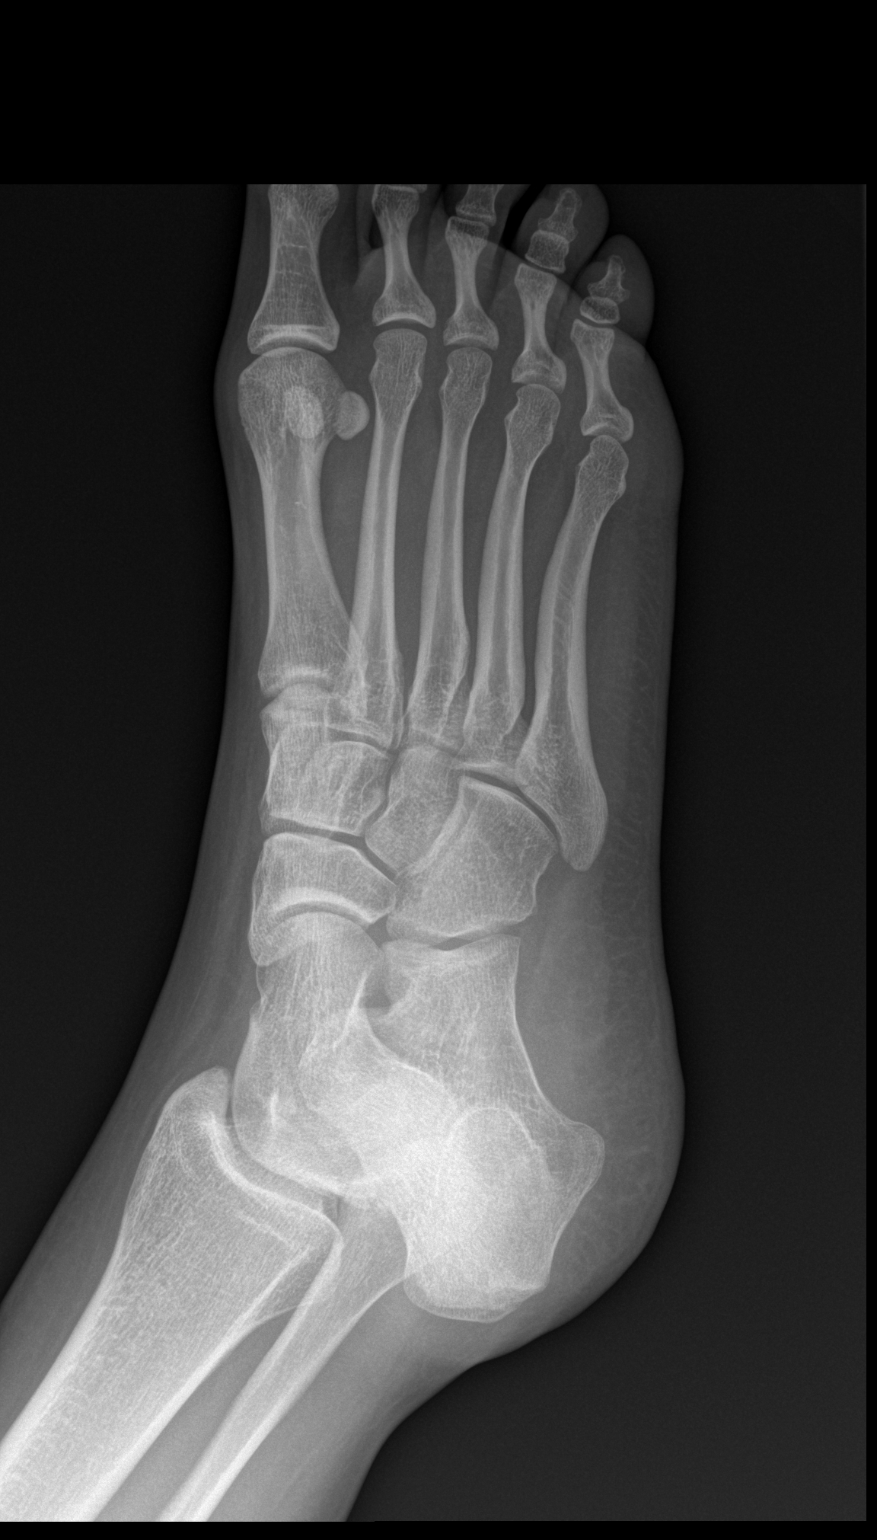

[x foot lat right]
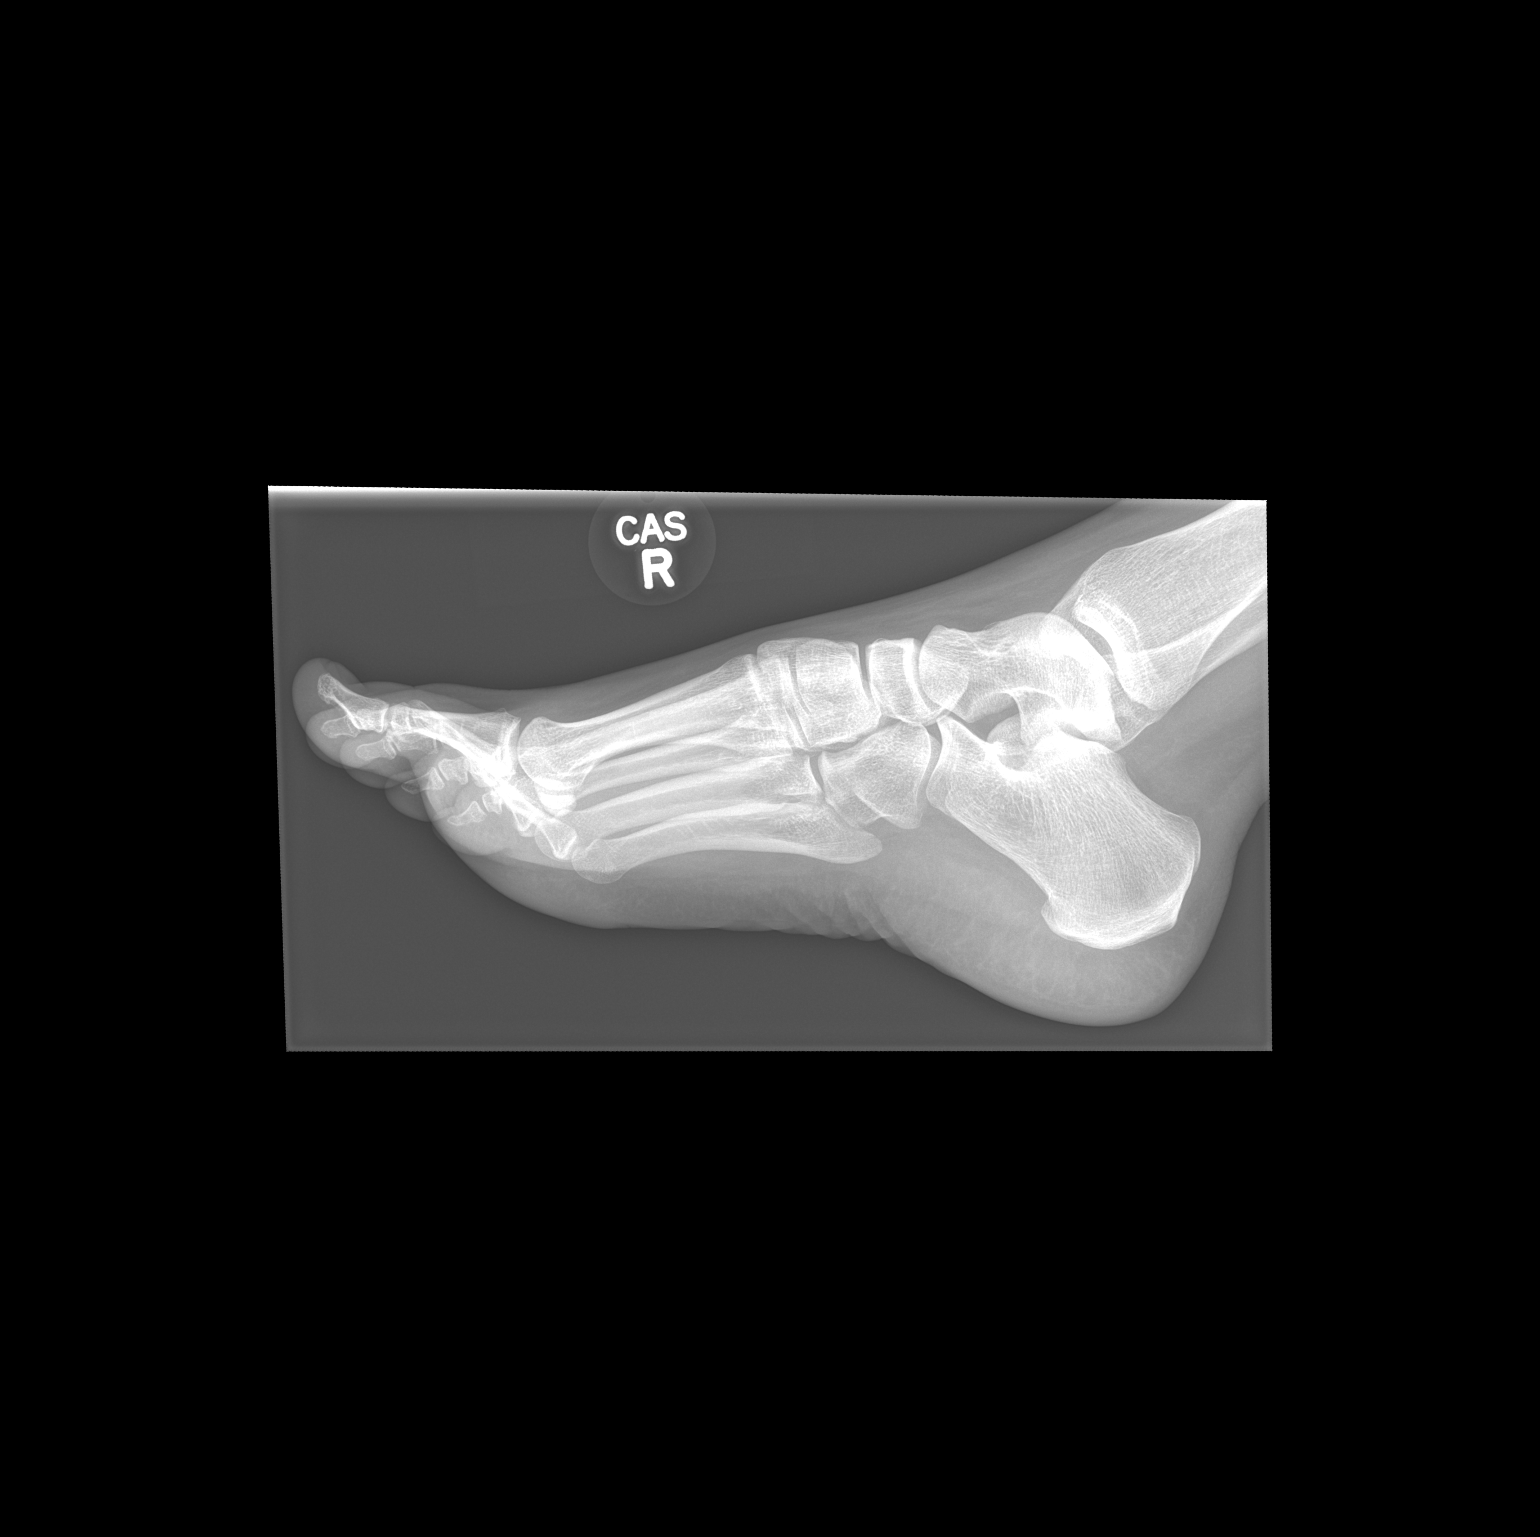

[3 of 3 positions shown; findings below may reference images not displayed]

FINDINGS: No malalignment at the Lisfranc joint. Metatarsals and phalanges
demonstrate no significant abnormality.

No calcaneal fracture observed. No foreign body. No acute bony
findings.
IMPRESSION: 1. No acute bony findings.

## 2015-11-04 ENCOUNTER — Emergency Department (HOSPITAL_COMMUNITY)
Admission: EM | Admit: 2015-11-04 | Discharge: 2015-11-04 | Disposition: A | Discharge status: Home / Self Care | Payer: Medicaid Other | Attending: Emergency Medicine | Admitting: Emergency Medicine

## 2015-11-04 ENCOUNTER — Encounter (HOSPITAL_COMMUNITY): Payer: Self-pay | Admitting: Emergency Medicine

## 2015-11-04 DIAGNOSIS — O9989 Other specified diseases and conditions complicating pregnancy, childbirth and the puerperium: Secondary | ICD-10-CM | POA: Diagnosis present

## 2015-11-04 DIAGNOSIS — Z79899 Other long term (current) drug therapy: Secondary | ICD-10-CM | POA: Diagnosis not present

## 2015-11-04 DIAGNOSIS — Z793 Long term (current) use of hormonal contraceptives: Secondary | ICD-10-CM | POA: Diagnosis not present

## 2015-11-04 DIAGNOSIS — G43809 Other migraine, not intractable, without status migrainosus: Secondary | ICD-10-CM | POA: Diagnosis not present

## 2015-11-04 DIAGNOSIS — Z3A22 22 weeks gestation of pregnancy: Secondary | ICD-10-CM | POA: Diagnosis not present

## 2015-11-04 DIAGNOSIS — O99352 Diseases of the nervous system complicating pregnancy, second trimester: Secondary | ICD-10-CM | POA: Diagnosis not present

## 2015-11-04 MED ORDER — METOCLOPRAMIDE HCL 5 MG/ML IJ SOLN
10.0000 mg | Freq: Once | INTRAMUSCULAR | Status: AC
  Administered 2015-11-04: 10 mg via INTRAMUSCULAR
  Filled 2015-11-04: qty 2

## 2015-11-04 MED ORDER — DIPHENHYDRAMINE HCL 50 MG/ML IJ SOLN
25.0000 mg | Freq: Once | INTRAMUSCULAR | Status: AC
  Administered 2015-11-04: 25 mg via INTRAMUSCULAR
  Filled 2015-11-04: qty 1

## 2015-11-04 NOTE — ED Notes (Signed)
Pt reports worsening migraine onset Friday; new onset emesis x2 today; positive for light sensitivity.

## 2015-11-04 NOTE — Discharge Instructions (Signed)

## 2015-11-04 NOTE — ED Provider Notes (Signed)
CSN: [id number]     Arrival date & time 11/04/15  1854 History   First MD Initiated Contact with Patient 11/04/15 2039     Chief Complaint  Patient presents with  . Migraine     (Consider location/radiation/quality/duration/timing/severity/associated sxs/prior Treatment) HPI Comments: Patient complaining of migraine headache times several days. History of migraines in the past and this is similar. 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 Denies any neck pain. No fever or chills. Location of headache is diffuse and similar to her prior. Had emesis 2 today without diarrhea. She is currently [redacted] weeks pregnant and denies any contractions or vaginal bleeding. Nothing makes her symptoms better. Used over-the-counter medications without relief  Patient is a 24 y.o. female presenting with migraines. The history is provided by the patient.  Migraine    History reviewed. No pertinent past medical history. Past Surgical History  Procedure Laterality Date  . Extraction of wisdom teeth     Family History  Problem Relation Age of Onset  . Cancer Father     lung   Social History  Substance Use Topics  . Smoking status: Never Smoker   . Smokeless tobacco: None  . Alcohol Use: Yes     Comment: occ   OB History    Gravida Para Term Preterm AB TAB SAB Ectopic Multiple Living   Review of Systems  All other systems reviewed and are negative.     Allergies  Cyclinex and Minocycline  Home Medications   Prior to Admission medications   Medication Sig Start Date End Date Taking? Authorizing Provider  acetaminophen (TYLENOL) 325 MG tablet Take 650 mg by mouth every 6 (six) hours as needed for headache.    Historical Provider, MD  ibuprofen (ADVIL,MOTRIN) 600 MG tablet Take 1 tablet (600 mg total) by mouth every 6 (six) hours as needed. Patient not taking: Reported on 08/23/2015 12/23/14   Ozella Rocks, MD  ipratropium (ATROVENT) 0.06 % nasal spray Place 2  sprays into both nostrils 4 (four) times daily. Patient not taking: Reported on 08/23/2015 12/23/14   Ozella Rocks, MD  meloxicam (MOBIC) 15 MG tablet Take 1 tablet (15 mg total) by mouth daily. Patient not taking: Reported on 08/23/2015 05/11/14   Ivonne Andrew, PA-C  Norgestimate-Ethinyl Estradiol Triphasic (ORTHO TRI-CYCLEN, 28,) 0.18/0.215/0.25 MG-35 MCG tablet Take 1 tablet by mouth daily.    Historical Provider, MD  ondansetron (ZOFRAN) 4 MG tablet Take 1 tablet (4 mg total) by mouth every 8 (eight) hours as needed for nausea or vomiting. 08/23/15   Courteney Lyn Mackuen, MD  Prenatal Vit-Fe Fumarate-FA (MULTIVITAMIN-PRENATAL) 27-0.8 MG TABS tablet Take 1 tablet by mouth daily.     Historical Provider, MD   BP 113/65 mmHg  Pulse 84  Temp(Src) 97.8 F (36.6 C) (Oral)  Resp 18  Ht  (1.651 m)  Wt 68.04 kg  BMI 24.96 kg/m2  SpO2 100%  LMP 12/23/2014 Physical Exam  Constitutional: She is oriented to person, place, and time. She appears well-developed and well-nourished.  Non-toxic appearance. No distress.  HENT:  Head: Normocephalic and atraumatic.  Eyes: Conjunctivae, EOM and lids are normal. Pupils are equal, round, and reactive to light.  Neck: Normal range of motion. Neck supple. No tracheal deviation present. No thyroid mass present.  Cardiovascular: Normal rate, regular rhythm and normal heart sounds.  Exam reveals no gallop.   No murmur heard. Pulmonary/Chest: Effort  normal and breath sounds normal. No stridor. No respiratory distress. She has no decreased breath sounds. She has no wheezes. She has no rhonchi. She has no rales.  Abdominal: Soft. Normal appearance and bowel sounds are normal. She exhibits no distension. There is no tenderness. There is no rebound and no CVA tenderness.  Musculoskeletal: Normal range of motion. She exhibits no edema or tenderness.  Neurological: She is alert and oriented to person, place, and time. She has normal strength. No cranial nerve deficit  or sensory deficit. GCS eye subscore is 4. GCS verbal subscore is 5. GCS motor subscore is 6.  Skin: Skin is warm and dry. No abrasion and no rash noted.  Psychiatric: She has a normal mood and affect. Her speech is normal and behavior is normal.  Nursing note and vitals reviewed.   ED Course  Procedures (including critical care time) Labs Review Labs Reviewed - No data to display  Imaging Review No results found. I have personally reviewed and evaluated these images and lab results as part of my medical decision-making.   EKG Interpretation None      MDM   Final diagnoses:  None    Patient given meds here for headache and feels better. Neurological exam is stable. Do not think that patient has subarachnoid bleed or meningitis. Repeat neurological exam at time of discharge remains stable    Lorre NickAnthony Nikol Lemar, MD 11/04/15 2228

## 2015-11-14 ENCOUNTER — Emergency Department (HOSPITAL_COMMUNITY)
Admission: EM | Admit: 2015-11-14 | Discharge: 2015-11-14 | Disposition: A | Discharge status: Home / Self Care | Payer: Medicaid Other | Attending: Emergency Medicine | Admitting: Emergency Medicine

## 2015-11-14 ENCOUNTER — Encounter (HOSPITAL_COMMUNITY): Payer: Self-pay | Admitting: *Deleted

## 2015-11-14 DIAGNOSIS — F17200 Nicotine dependence, unspecified, uncomplicated: Secondary | ICD-10-CM | POA: Insufficient documentation

## 2015-11-14 DIAGNOSIS — O99332 Smoking (tobacco) complicating pregnancy, second trimester: Secondary | ICD-10-CM | POA: Insufficient documentation

## 2015-11-14 DIAGNOSIS — Y9389 Activity, other specified: Secondary | ICD-10-CM | POA: Diagnosis not present

## 2015-11-14 DIAGNOSIS — Z3A23 23 weeks gestation of pregnancy: Secondary | ICD-10-CM | POA: Diagnosis not present

## 2015-11-14 DIAGNOSIS — Z043 Encounter for examination and observation following other accident: Secondary | ICD-10-CM | POA: Diagnosis not present

## 2015-11-14 DIAGNOSIS — Y998 Other external cause status: Secondary | ICD-10-CM | POA: Insufficient documentation

## 2015-11-14 DIAGNOSIS — Y9289 Other specified places as the place of occurrence of the external cause: Secondary | ICD-10-CM | POA: Diagnosis not present

## 2015-11-14 DIAGNOSIS — Z79899 Other long term (current) drug therapy: Secondary | ICD-10-CM | POA: Insufficient documentation

## 2015-11-14 DIAGNOSIS — O9A212 Injury, poisoning and certain other consequences of external causes complicating pregnancy, second trimester: Secondary | ICD-10-CM | POA: Diagnosis not present
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
## 2015-11-14 NOTE — Progress Notes (Signed)
Patient listed as having [employer] insurance.  [facility] listed on patient's insurance card [facility] [phone].  System updated.

## 2015-11-14 NOTE — ED Notes (Signed)
Fetal heart tones 160bpm
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
## 2016-02-11 LAB — OB RESULTS CONSOLE GBS: GBS: NEGATIVE

## 2016-02-11 LAB — OB RESULTS CONSOLE GC/CHLAMYDIA
Chlamydia: NEGATIVE
Gonorrhea: NEGATIVE

## 2016-03-07 ENCOUNTER — Inpatient Hospital Stay (HOSPITAL_COMMUNITY)
Admission: AD | Admit: 2016-03-07 | Discharge: 2016-03-07 | Disposition: A | Discharge status: Home / Self Care | Payer: Medicaid Other | Source: Ambulatory Visit | Attending: Obstetrics and Gynecology | Admitting: Obstetrics and Gynecology

## 2016-03-07 ENCOUNTER — Encounter (HOSPITAL_COMMUNITY): Payer: Self-pay | Admitting: *Deleted

## 2016-03-07 HISTORY — DX: Other specified health status: Z78.9

## 2016-03-07 MED ORDER — ZOLPIDEM TARTRATE 5 MG PO TABS
5.0000 mg | ORAL_TABLET | Freq: Once | ORAL | Status: AC
Start: 2016-03-07 — End: 2016-03-07
  Administered 2016-03-07: 5 mg via ORAL
  Filled 2016-03-07: qty 1

## 2016-03-07 NOTE — MAU Note (Signed)
Pt states she is feeling lower abdominal contraction pain that started around 0200 this morning.  Pt states she passed some blood and mucus the size of a quarter around 1200.  Pt states the contractions are around 10, 15, or 20 minutes apart.  Pt states none have been really close together.  Pt states the pain started in the lower abdomen and then began to move towards the lower back.

## 2016-03-07 NOTE — MAU Note (Signed)
Pt was seen MAU earlier today and was 3-4cm. Ctxs closer and stronger now. Denies LOF or bleeding

## 2016-03-07 NOTE — Progress Notes (Signed)
Dr Henderson CloudHorvath notified of patients complaints, cervical exam.

## 2016-03-07 NOTE — Progress Notes (Signed)
Dr. Henderson CloudHorvath notified of pt in MAU.  Notified that pt is a G2P1 at [id number].  Notified that pt states the last two contractions she felt were on her 25 minute drive here and that pt states she has not felt any contractions since being on the monitor and none are tracing.  Notified that pt is 3.5, 50, -3, posterior, and vertex.  Provider states to recheck in one hour and if strip is reactive and pt is unchanged then pt can be discharged.

## 2016-03-07 NOTE — Progress Notes (Signed)
Dr. Henderson CloudHorvath [place] to [place] and just come back when the contractions go worse.  Provider states pt can be discharged with labor precautions when strip is reactive.

## 2016-03-07 NOTE — Discharge Instructions (Signed)
Third Trimester of Pregnancy °The third trimester is from week 29 through week 42, months 7 through 9. The third trimester is a time when the fetus is growing rapidly. At the end of the ninth month, the fetus is about 20 inches in length and weighs 6-10 pounds.  °BODY CHANGES °Your body goes through many changes during pregnancy. The changes vary from woman to woman.  °· Your weight will continue to increase. You can expect to gain 25-35 pounds (11-16 kg) by the end of the pregnancy. °· You may begin to get stretch marks on your hips, abdomen, and breasts. °· You may urinate more often because the fetus is moving lower into your pelvis and pressing on your bladder. °· You may develop or continue to have heartburn as a result of your pregnancy. °· You may develop constipation because certain hormones are causing the muscles that push waste through your intestines to slow down. °· You may develop hemorrhoids or swollen, bulging veins (varicose veins). °· You may have pelvic pain because of the weight gain and pregnancy hormones relaxing your joints between the bones in your pelvis. Backaches may result from overexertion of the muscles supporting your posture. °· You may have changes in your hair. These can include thickening of your hair, rapid growth, and changes in texture. Some women also have hair loss during or after pregnancy, or hair that feels dry or thin. Your hair will most likely return to normal after your baby is born. °· Your breasts will continue to grow and be tender. A yellow discharge may leak from your breasts called colostrum. °· Your belly button may stick out. °· You may feel short of breath because of your expanding uterus. °· You may notice the fetus "dropping," or moving lower in your abdomen. °· You may have a bloody mucus discharge. This usually occurs a few days to a week before labor begins. °· Your cervix becomes thin and soft (effaced) near your due date. °WHAT TO EXPECT AT YOUR PRENATAL  EXAMS  °You will have prenatal exams every 2 weeks until week 36. Then, you will have weekly prenatal exams. During a routine prenatal visit: °· You will be weighed to make sure you and the fetus are growing normally. °· Your blood pressure is taken. °· Your abdomen will be measured to track your baby's growth. °· The fetal heartbeat will be listened to. °· Any test results from the previous visit will be discussed. °· You may have a cervical check near your due date to see if you have effaced. °At around 36 weeks, your caregiver will check your cervix. At the same time, your caregiver will also perform a test on the secretions of the vaginal tissue. This test is to determine if a type of bacteria, Group B streptococcus, is present. Your caregiver will explain this further. °Your caregiver may ask you: °· What your birth plan is. °· How you are feeling. °· If you are feeling the baby move. °· If you have had any abnormal symptoms, such as leaking fluid, bleeding, severe headaches, or abdominal cramping. °· If you are using any tobacco products, including cigarettes, chewing tobacco, and electronic cigarettes. °· If you have any questions. °Other tests or screenings that may be performed during your third trimester include: °· Blood tests that check for low iron levels (anemia). °· Fetal testing to check the health, activity level, and growth of the fetus. Testing is done if you have certain medical conditions or if   there are problems during the pregnancy. °· HIV (human immunodeficiency virus) testing. If you are at high risk, you may be screened for HIV during your third trimester of pregnancy. °FALSE LABOR °You may feel small, irregular contractions that eventually go away. These are called Braxton Hicks contractions, or false labor. Contractions may last for hours, days, or even weeks before true labor sets in. If contractions come at regular intervals, intensify, or become painful, it is best to be seen by your  caregiver.  °SIGNS OF LABOR  °· Menstrual-like cramps. °· Contractions that are 5 minutes apart or less. °· Contractions that start on the top of the uterus and spread down to the lower abdomen and back. °· A sense of increased pelvic pressure or back pain. °· A watery or bloody mucus discharge that comes from the vagina. °If you have any of these signs before the 37th week of pregnancy, call your caregiver right away. You need to go to the hospital to get checked immediately. °HOME CARE INSTRUCTIONS  °· Avoid all smoking, herbs, alcohol, and unprescribed drugs. These chemicals affect the formation and growth of the baby. °· Do not use any tobacco products, including cigarettes, chewing tobacco, and electronic cigarettes. If you need help quitting, ask your health care provider. You may receive counseling support and other resources to help you quit. °· Follow your caregiver's instructions regarding medicine use. There are medicines that are either safe or unsafe to take during pregnancy. °· Exercise only as directed by your caregiver. Experiencing uterine cramps is a good sign to stop exercising. °· Continue to eat regular, healthy meals. °· Wear a good support bra for breast tenderness. °· Do not use hot tubs, steam rooms, or saunas. °· Wear your seat belt at all times when driving. °· Avoid raw meat, uncooked cheese, cat litter boxes, and soil used by cats. These carry germs that can cause birth defects in the baby. °· Take your prenatal vitamins. °· Take 1500-2000 mg of calcium daily starting at the 20th week of pregnancy until you deliver your baby. °· Try taking a stool softener (if your caregiver approves) if you develop constipation. Eat more high-fiber foods, such as fresh vegetables or fruit and whole grains. Drink plenty of fluids to keep your urine clear or pale yellow. °· Take warm sitz baths to soothe any pain or discomfort caused by hemorrhoids. Use hemorrhoid cream if your caregiver approves. °· If  you develop varicose veins, wear support hose. Elevate your feet for 15 minutes, 3-4 times a day. Limit salt in your diet. °· Avoid heavy lifting, wear low heal shoes, and practice good posture. °· Rest a lot with your legs elevated if you have leg cramps or low back pain. °· Visit your dentist if you have not gone during your pregnancy. Use a soft toothbrush to brush your teeth and be gentle when you floss. °· A sexual relationship may be continued unless your caregiver directs you otherwise. °· Do not travel far distances unless it is absolutely necessary and only with the approval of your caregiver. °· Take prenatal classes to understand, practice, and ask questions about the labor and delivery. °· Make a trial run to the hospital. °· Pack your hospital bag. °· Prepare the baby's nursery. °· Continue to go to all your prenatal visits as directed by your caregiver. °SEEK MEDICAL CARE IF: °· You are unsure if you are in labor or if your water has broken. °· You have dizziness. °· You have   mild pelvic cramps, pelvic pressure, or nagging pain in your abdominal area. °· You have persistent nausea, vomiting, or diarrhea. °· You have a bad smelling vaginal discharge. °· You have pain with urination. °SEEK IMMEDIATE MEDICAL CARE IF:  °· You have a fever. °· You are leaking fluid from your vagina. °· You have spotting or bleeding from your vagina. °· You have severe abdominal cramping or pain. °· You have rapid weight loss or gain. °· You have shortness of breath with chest pain. °· You notice sudden or extreme swelling of your face, hands, ankles, feet, or legs. °· You have not felt your baby move in over an hour. °· You have severe headaches that do not go away with medicine. °· You have vision changes. °  °This information is not intended to replace advice given to you by your health care provider. Make sure you discuss any questions you have with your health care provider. °  °Document Released: 10/27/2001 Document  Revised: 11/23/2014 Document Reviewed: 01/03/2013 °Elsevier Interactive Patient Education ©2016 [employer] °Fetal Movement Counts °Patient Name: __________________________________________________ Patient Due Date: ____________________ °Performing a fetal movement count is highly recommended in high-risk pregnancies, but it is good for every pregnant woman to do. Your health care provider may ask you to start counting fetal movements at 28 weeks of the pregnancy. Fetal movements often increase: °· After eating a full meal. °· After physical activity. °· After eating or drinking something sweet or cold. °· At rest. °Pay attention to when you feel the baby is most active. This will help you notice a pattern of your baby's sleep and wake cycles and what factors contribute to an increase in fetal movement. It is important to perform a fetal movement count at the same time each day when your baby is normally most active.  °HOW TO COUNT FETAL MOVEMENTS °· Find a quiet and comfortable area to sit or lie down on your left side. Lying on your left side provides the best blood and oxygen circulation to your baby. °· Write down the day and time on a sheet of paper or in a journal. °· Start counting kicks, flutters, swishes, rolls, or jabs in a 2-hour period. You should feel at least 10 movements within 2 hours. °· If you do not feel 10 movements in 2 hours, wait 2-3 hours and count again. Look for a change in the pattern or not enough counts in 2 hours. °SEEK MEDICAL CARE IF: °· You feel less than 10 counts in 2 hours, tried twice. °· There is no movement in over an hour. °· The pattern is changing or taking longer each day to reach 10 counts in 2 hours. °· You feel the baby is not moving as he or she usually does. °Date: ____________ Movements: ____________ Start time: ____________ Finish time: ____________  °Date: ____________ Movements: ____________ Start time: ____________ Finish time: ____________ °Date: ____________  Movements: ____________ Start time: ____________ Finish time: ____________ °Date: ____________ Movements: ____________ Start time: ____________ Finish time: ____________ °Date: ____________ Movements: ____________ Start time: ____________ Finish time: ____________ °Date: ____________ Movements: ____________ Start time: ____________ Finish time: ____________ °Date: ____________ Movements: ____________ Start time: ____________ Finish time: ____________ °Date: ____________ Movements: ____________ Start time: ____________ Finish time: ____________  °Date: ____________ Movements: ____________ Start time: ____________ Finish time: ____________ °Date: ____________ Movements: ____________ Start time: ____________ Finish time: ____________ °Date: ____________ Movements: ____________ Start time: ____________ Finish time: ____________ °Date: ____________ Movements: ____________ Start time: ____________ Finish time: ____________ °Date:   ____________ Movements: ____________ Start time: ____________ Finish time: ____________ °Date: ____________ Movements: ____________ Start time: ____________ Finish time: ____________ °Date: ____________ Movements: ____________ Start time: ____________ Finish time: ____________  °Date: ____________ Movements: ____________ Start time: ____________ Finish time: ____________ °Date: ____________ Movements: ____________ Start time: ____________ Finish time: ____________ °Date: ____________ Movements: ____________ Start time: ____________ Finish time: ____________ °Date: ____________ Movements: ____________ Start time: ____________ Finish time: ____________ °Date: ____________ Movements: ____________ Start time: ____________ Finish time: ____________ °Date: ____________ Movements: ____________ Start time: ____________ Finish time: ____________ °Date: ____________ Movements: ____________ Start time: ____________ Finish time: ____________  °Date: ____________ Movements: ____________ Start time:  ____________ Finish time: ____________ °Date: ____________ Movements: ____________ Start time: ____________ Finish time: ____________ °Date: ____________ Movements: ____________ Start time: ____________ Finish time: ____________ °Date: ____________ Movements: ____________ Start time: ____________ Finish time: ____________ °Date: ____________ Movements: ____________ Start time: ____________ Finish time: ____________ °Date: ____________ Movements: ____________ Start time: ____________ Finish time: ____________ °Date: ____________ Movements: ____________ Start time: ____________ Finish time: ____________  °Date: ____________ Movements: ____________ Start time: ____________ Finish time: ____________ °Date: ____________ Movements: ____________ Start time: ____________ Finish time: ____________ °Date: ____________ Movements: ____________ Start time: ____________ Finish time: ____________ °Date: ____________ Movements: ____________ Start time: ____________ Finish time: ____________ °Date: ____________ Movements: ____________ Start time: ____________ Finish time: ____________ °Date: ____________ Movements: ____________ Start time: ____________ Finish time: ____________ °Date: ____________ Movements: ____________ Start time: ____________ Finish time: ____________  °Date: ____________ Movements: ____________ Start time: ____________ Finish time: ____________ °Date: ____________ Movements: ____________ Start time: ____________ Finish time: ____________ °Date: ____________ Movements: ____________ Start time: ____________ Finish time: ____________ °Date: ____________ Movements: ____________ Start time: ____________ Finish time: ____________ °Date: ____________ Movements: ____________ Start time: ____________ Finish time: ____________ °Date: ____________ Movements: ____________ Start time: ____________ Finish time: ____________ °Date: ____________ Movements: ____________ Start time: ____________ Finish time: ____________  °Date:  ____________ Movements: ____________ Start time: ____________ Finish time: ____________ °Date: ____________ Movements: ____________ Start time: ____________ Finish time: ____________ °Date: ____________ Movements: ____________ Start time: ____________ Finish time: ____________ °Date: ____________ Movements: ____________ Start time: ____________ Finish time: ____________ °Date: ____________ Movements: ____________ Start time: ____________ Finish time: ____________ °Date: ____________ Movements: ____________ Start time: ____________ Finish time: ____________ °Date: ____________ Movements: ____________ Start time: ____________ Finish time: ____________  °Date: ____________ Movements: ____________ Start time: ____________ Finish time: ____________ °Date: ____________ Movements: ____________ Start time: ____________ Finish time: ____________ °Date: ____________ Movements: ____________ Start time: ____________ Finish time: ____________ °Date: ____________ Movements: ____________ Start time: ____________ Finish time: ____________ °Date: ____________ Movements: ____________ Start time: ____________ Finish time: ____________ °Date: ____________ Movements: ____________ Start time: ____________ Finish time: ____________ °  °This information is not intended to replace advice given to you by your health care provider. Make sure you discuss any questions you have with your health care provider. °  °Document Released: 12/02/2006 Document Revised: 11/23/2014 Document Reviewed: 08/29/2012 °[employer]vier Interactive Patient Education ©2016 [employer] °Braxton Hicks Contractions °Contractions of the uterus can occur throughout pregnancy. Contractions are not always a sign that you are in labor.  °WHAT ARE BRAXTON HICKS CONTRACTIONS?  °Contractions that occur before labor are called Braxton Hicks contractions, or false labor. Toward the end of pregnancy (32-34 weeks), these contractions can develop more often and may become more  forceful. This is not true labor because these contractions do not result in opening (dilatation) and thinning of the cervix. They are sometimes difficult to tell apart from true labor because these contractions can be forceful and people have different pain tolerances. You should   not feel embarrassed if you go to the hospital with false labor. Sometimes, the only way to tell if you are in true labor is for your health care provider to look for changes in the cervix. °If there are no prenatal problems or other health problems associated with the pregnancy, it is completely safe to be sent home with false labor and await the onset of true labor. °HOW CAN YOU TELL THE DIFFERENCE BETWEEN TRUE AND FALSE LABOR? °False Labor °· The contractions of false labor are usually shorter and not as hard as those of true labor.   °· The contractions are usually irregular.   °· The contractions are often felt in the front of the lower abdomen and in the groin.   °· The contractions may go away when you walk around or change positions while lying down.   °· The contractions get weaker and are shorter lasting as time goes on.   °· The contractions do not usually become progressively stronger, regular, and closer together as with true labor.   °True Labor °· Contractions in true labor last 30-70 seconds, become very regular, usually become more intense, and increase in frequency.   °· The contractions do not go away with walking.   °· The discomfort is usually felt in the top of the uterus and spreads to the lower abdomen and low back.   °· True labor can be determined by your health care provider with an exam. This will show that the cervix is dilating and getting thinner.   °WHAT TO REMEMBER °· Keep up with your usual exercises and follow other instructions given by your health care provider.   °· Take medicines as directed by your health care provider.   °· Keep your regular prenatal appointments.   °· Eat and drink lightly if you  think you are going into labor.   °· If Braxton Hicks contractions are making you uncomfortable:   °· Change your position from lying down or resting to walking, or from walking to resting.   °· Sit and rest in a tub of warm water.   °· Drink 2-3 glasses of water. Dehydration may cause these contractions.   °· Do slow and deep breathing several times an hour.   °WHEN SHOULD I SEEK IMMEDIATE MEDICAL CARE? °Seek immediate medical care if: °· Your contractions become stronger, more regular, and closer together.   °· You have fluid leaking or gushing from your vagina.   °· You have a fever.   °· You pass blood-tinged mucus.   °· You have vaginal bleeding.   °· You have continuous abdominal pain.   °· You have low back pain that you never had before.   °· You feel your baby's head pushing down and causing pelvic pressure.   °· Your baby is not moving as much as it used to.   °  °This information is not intended to replace advice given to you by your health care provider. Make sure you discuss any questions you have with your health care provider. °  °Document Released: 11/02/2005 Document Revised: 11/07/2013 Document Reviewed: 08/14/2013 °Elsevier Interactive Patient Education ©2016 [employer] ° °

## 2016-03-08 ENCOUNTER — Inpatient Hospital Stay (HOSPITAL_COMMUNITY): Payer: Medicaid Other | Admitting: Anesthesiology

## 2016-03-08 ENCOUNTER — Encounter (HOSPITAL_COMMUNITY): Payer: Self-pay

## 2016-03-08 ENCOUNTER — Inpatient Hospital Stay (HOSPITAL_COMMUNITY)
Admission: AD | Admit: 2016-03-08 | Discharge: 2016-03-10 | DRG: 775 | Disposition: A | Discharge status: Home / Self Care | Payer: Medicaid Other | Source: Ambulatory Visit | Attending: Obstetrics and Gynecology | Admitting: Obstetrics and Gynecology

## 2016-03-08 DIAGNOSIS — Z87891 Personal history of nicotine dependence: Secondary | ICD-10-CM

## 2016-03-08 DIAGNOSIS — Z3A39 39 weeks gestation of pregnancy: Secondary | ICD-10-CM

## 2016-03-08 LAB — CBC
HCT: 32 % — ABNORMAL LOW (ref 36.0–46.0)
Hemoglobin: 10.8 g/dL — ABNORMAL LOW (ref 12.0–15.0)
MCH: 31 pg (ref 26.0–34.0)
MCHC: 33.8 g/dL (ref 30.0–36.0)
MCV: 92 fL (ref 78.0–100.0)
Platelets: 208 10*3/uL (ref 150–400)
RBC: 3.48 MIL/uL — ABNORMAL LOW (ref 3.87–5.11)
RDW: 14.5 % (ref 11.5–15.5)
WBC: 12.3 10*3/uL — ABNORMAL HIGH (ref 4.0–10.5)

## 2016-03-08 LAB — TYPE AND SCREEN
ABO/RH(D): A POS
Antibody Screen: NEGATIVE

## 2016-03-08 LAB — ABO/RH: ABO/RH(D): A POS

## 2016-03-08 LAB — RPR: RPR Ser Ql: NONREACTIVE

## 2016-03-08 MED ORDER — DIPHENHYDRAMINE HCL 50 MG/ML IJ SOLN: 12.5000 mg | INTRAMUSCULAR | Status: DC | PRN

## 2016-03-08 MED ORDER — OXYTOCIN 10 UNIT/ML IJ SOLN
2.5000 [IU]/h | INTRAMUSCULAR | Status: DC
  Administered 2016-03-08: 2.5 [IU]/h via INTRAVENOUS
  Filled 2016-03-08: qty 4

## 2016-03-08 MED ORDER — FLEET ENEMA 7-19 GM/118ML RE ENEM: 1.0000 | ENEMA | RECTAL | Status: DC | PRN

## 2016-03-08 MED ORDER — MAGNESIUM HYDROXIDE 400 MG/5ML PO SUSP: 30.0000 mL | ORAL | Status: DC | PRN

## 2016-03-08 MED ORDER — ZOLPIDEM TARTRATE 5 MG PO TABS: 5.0000 mg | ORAL_TABLET | Freq: Every evening | ORAL | Status: DC | PRN

## 2016-03-08 MED ORDER — OXYCODONE-ACETAMINOPHEN 5-325 MG PO TABS: 1.0000 | ORAL_TABLET | ORAL | Status: DC | PRN

## 2016-03-08 MED ORDER — PRENATAL MULTIVITAMIN CH
1.0000 | ORAL_TABLET | Freq: Every day | ORAL | Status: DC
  Administered 2016-03-08 – 2016-03-10 (×3): 1 via ORAL
  Filled 2016-03-08 (×3): qty 1

## 2016-03-08 MED ORDER — EPHEDRINE 5 MG/ML INJ
10.0000 mg | INTRAVENOUS | Status: DC | PRN
  Filled 2016-03-08: qty 2

## 2016-03-08 MED ORDER — ONDANSETRON HCL 40 MG/20ML IJ SOLN
8.0000 mg | Freq: Once | INTRAMUSCULAR | Status: AC
  Administered 2016-03-08: 8 mg via INTRAVENOUS
  Filled 2016-03-08: qty 4

## 2016-03-08 MED ORDER — OXYCODONE-ACETAMINOPHEN 5-325 MG PO TABS
1.0000 | ORAL_TABLET | ORAL | Status: DC | PRN
  Administered 2016-03-08 – 2016-03-10 (×3): 1 via ORAL
  Filled 2016-03-08 (×3): qty 1

## 2016-03-08 MED ORDER — PHENYLEPHRINE 40 MCG/ML (10ML) SYRINGE FOR IV PUSH (FOR BLOOD PRESSURE SUPPORT)
80.0000 ug | PREFILLED_SYRINGE | INTRAVENOUS | Status: DC | PRN
  Filled 2016-03-08: qty 2

## 2016-03-08 MED ORDER — PHENYLEPHRINE 40 MCG/ML (10ML) SYRINGE FOR IV PUSH (FOR BLOOD PRESSURE SUPPORT)
80.0000 ug | PREFILLED_SYRINGE | INTRAVENOUS | Status: DC | PRN
  Filled 2016-03-08: qty 2
  Filled 2016-03-08: qty 20

## 2016-03-08 MED ORDER — WITCH HAZEL-GLYCERIN EX PADS: 1.0000 "application " | MEDICATED_PAD | CUTANEOUS | Status: DC | PRN

## 2016-03-08 MED ORDER — SODIUM CHLORIDE 0.9% FLUSH: 3.0000 mL | INTRAVENOUS | Status: DC | PRN

## 2016-03-08 MED ORDER — CITRIC ACID-SODIUM CITRATE 334-500 MG/5ML PO SOLN: 30.0000 mL | ORAL | Status: DC | PRN

## 2016-03-08 MED ORDER — SODIUM CHLORIDE 0.9 % IV SOLN: 250.0000 mL | INTRAVENOUS | Status: DC | PRN

## 2016-03-08 MED ORDER — ONDANSETRON HCL 4 MG/2ML IJ SOLN: 4.0000 mg | INTRAMUSCULAR | Status: DC | PRN

## 2016-03-08 MED ORDER — IBUPROFEN 800 MG PO TABS
800.0000 mg | ORAL_TABLET | Freq: Three times a day (TID) | ORAL | Status: DC
  Administered 2016-03-08 – 2016-03-10 (×7): 800 mg via ORAL
  Filled 2016-03-08 (×7): qty 1

## 2016-03-08 MED ORDER — OXYCODONE-ACETAMINOPHEN 5-325 MG PO TABS: 2.0000 | ORAL_TABLET | ORAL | Status: DC | PRN

## 2016-03-08 MED ORDER — LIDOCAINE HCL (PF) 1 % IJ SOLN
INTRAMUSCULAR | Status: DC | PRN
  Administered 2016-03-08 (×2): 4 mL

## 2016-03-08 MED ORDER — ACETAMINOPHEN 325 MG PO TABS
650.0000 mg | ORAL_TABLET | ORAL | Status: DC | PRN
  Filled 2016-03-08: qty 2

## 2016-03-08 MED ORDER — FENTANYL 2.5 MCG/ML BUPIVACAINE 1/10 % EPIDURAL INFUSION (WH - ANES)
14.0000 mL/h | INTRAMUSCULAR | Status: DC | PRN
  Administered 2016-03-08: 14 mL/h via EPIDURAL
  Filled 2016-03-08: qty 125

## 2016-03-08 MED ORDER — DIBUCAINE 1 % RE OINT: 1.0000 "application " | TOPICAL_OINTMENT | RECTAL | Status: DC | PRN

## 2016-03-08 MED ORDER — DIPHENHYDRAMINE HCL 25 MG PO CAPS
25.0000 mg | ORAL_CAPSULE | Freq: Four times a day (QID) | ORAL | Status: DC | PRN
Start: 2016-03-08 — End: 2016-03-10

## 2016-03-08 MED ORDER — TETANUS-DIPHTH-ACELL PERTUSSIS 5-2.5-18.5 LF-MCG/0.5 IM SUSP
0.5000 mL | Freq: Once | INTRAMUSCULAR | Status: AC
  Administered 2016-03-09: 0.5 mL via INTRAMUSCULAR
  Filled 2016-03-08: qty 0.5

## 2016-03-08 MED ORDER — LACTATED RINGERS IV SOLN: 500.0000 mL | Freq: Once | INTRAVENOUS | Status: DC

## 2016-03-08 MED ORDER — FERROUS SULFATE 325 (65 FE) MG PO TABS
325.0000 mg | ORAL_TABLET | Freq: Two times a day (BID) | ORAL | Status: DC
  Administered 2016-03-08 – 2016-03-10 (×5): 325 mg via ORAL
  Filled 2016-03-08 (×5): qty 1

## 2016-03-08 MED ORDER — BENZOCAINE-MENTHOL 20-0.5 % EX AERO
1.0000 "application " | INHALATION_SPRAY | CUTANEOUS | Status: DC | PRN
  Administered 2016-03-08: 1 via TOPICAL
  Filled 2016-03-08: qty 56

## 2016-03-08 MED ORDER — FAMOTIDINE 20 MG PO TABS
20.0000 mg | ORAL_TABLET | Freq: Two times a day (BID) | ORAL | Status: DC
Start: 2016-03-08 — End: 2016-03-10
  Administered 2016-03-08 (×2): 20 mg via ORAL
  Filled 2016-03-08 (×3): qty 1

## 2016-03-08 MED ORDER — METHYLERGONOVINE MALEATE 0.2 MG PO TABS: 0.2000 mg | ORAL_TABLET | ORAL | Status: DC | PRN

## 2016-03-08 MED ORDER — LACTATED RINGERS IV SOLN: 500.0000 mL | INTRAVENOUS | Status: DC | PRN

## 2016-03-08 MED ORDER — OXYCODONE-ACETAMINOPHEN 5-325 MG PO TABS
2.0000 | ORAL_TABLET | ORAL | Status: DC | PRN
  Administered 2016-03-09: 2 via ORAL
  Filled 2016-03-08: qty 2

## 2016-03-08 MED ORDER — SODIUM CHLORIDE 0.9% FLUSH
3.0000 mL | Freq: Two times a day (BID) | INTRAVENOUS | Status: DC
  Administered 2016-03-08: 3 mL via INTRAVENOUS

## 2016-03-08 MED ORDER — ONDANSETRON HCL 4 MG/2ML IJ SOLN: 4.0000 mg | Freq: Four times a day (QID) | INTRAMUSCULAR | Status: DC | PRN

## 2016-03-08 MED ORDER — OXYTOCIN BOLUS FROM INFUSION
500.0000 mL | INTRAVENOUS | Status: DC
Start: 2016-03-08 — End: 2016-03-08
  Administered 2016-03-08: 500 mL via INTRAVENOUS

## 2016-03-08 MED ORDER — METHYLERGONOVINE MALEATE 0.2 MG/ML IJ SOLN: 0.2000 mg | INTRAMUSCULAR | Status: DC | PRN

## 2016-03-08 MED ORDER — SIMETHICONE 80 MG PO CHEW: 80.0000 mg | CHEWABLE_TABLET | ORAL | Status: DC | PRN

## 2016-03-08 MED ORDER — ACETAMINOPHEN 325 MG PO TABS: 650.0000 mg | ORAL_TABLET | ORAL | Status: DC | PRN

## 2016-03-08 MED ORDER — LIDOCAINE HCL (PF) 1 % IJ SOLN
30.0000 mL | INTRAMUSCULAR | Status: DC | PRN
  Filled 2016-03-08: qty 30

## 2016-03-08 MED ORDER — LACTATED RINGERS IV SOLN
INTRAVENOUS | Status: DC
  Administered 2016-03-08 (×2): via INTRAVENOUS

## 2016-03-08 MED ORDER — MEASLES, MUMPS & RUBELLA VAC ~~LOC~~ INJ
0.5000 mL | INJECTION | Freq: Once | SUBCUTANEOUS | Status: DC
  Filled 2016-03-08: qty 0.5

## 2016-03-08 MED ORDER — ONDANSETRON HCL 4 MG PO TABS: 4.0000 mg | ORAL_TABLET | ORAL | Status: DC | PRN

## 2016-03-08 MED ORDER — COCONUT OIL OIL
1.0000 "application " | TOPICAL_OIL | Status: DC | PRN
  Filled 2016-03-08: qty 120

## 2016-03-08 MED ORDER — SENNOSIDES-DOCUSATE SODIUM 8.6-50 MG PO TABS
2.0000 | ORAL_TABLET | ORAL | Status: DC
  Administered 2016-03-09: 2 via ORAL
  Filled 2016-03-08 (×2): qty 2

## 2016-03-08 NOTE — Lactation Note (Signed)
This note was copied from a baby'[name] chart. Lactation Consultation Note  Mom called out for assist.  Baby is showing feeding cues and mom unable to latch baby beyond nipple.  Mom has been hand expressing for past 2 months and she is leaking large volume of milk.  On exam baby noted to have a tight anterior frenulum.  Tongue is heart shaped.  Baby latches with good breast compression but slips off after 2-3 sucks.  20 mm nipple shield applied which allowed baby to sustain latch somewhat longer but mom uncomfortable.  Spoke with Dr. Remonia RichterGrier about frenulum and breastfeeding challenges.  She will assess and make recommendation.  Encouraged mom to call for assist prn.  Patient Name: Cathy [name] Reason for consult: Follow-up assessment;Difficult latch   Maternal Data    Feeding Feeding Type: Breast Fed Length of feed: 10 min  LATCH Score/Interventions Latch: Repeated attempts needed to sustain latch, nipple held in mouth throughout feeding, stimulation needed to elicit sucking reflex. Intervention([name]): Skin to skin;Teach feeding cues;Waking techniques Intervention([name]): Breast compression;Breast massage;Assist with latch;Adjust position  Audible Swallowing: A few with stimulation Intervention([name]): Skin to skin;Hand expression;Alternate breast massage  Type of Nipple: Everted at rest and after stimulation  Comfort (Breast/Nipple): Soft / non-tender     Hold (Positioning): Assistance needed to correctly position infant at breast and maintain latch. Intervention([name]): Breastfeeding basics reviewed;Support Pillows;Position options;Skin to skin  LATCH Score: 7  Lactation Tools Discussed/Used Tools: Nipple Shields Nipple shield size: 20   Consult Status Consult Status: Follow-up Date: 03/09/16 Follow-up type: In-patient    Huston FoleyMOULDEN, Cathy [name] [name], 1:53 PM

## 2016-03-08 NOTE — MAU Note (Signed)
Contractions worse. Was 2-3 cm earlier here tonight.  No leaking.  Bloody show sm amt. Baby moving well. Ambien didn't help contractions got more painful and woke me.

## 2016-03-08 NOTE — H&P (Signed)
25 y.o. 539w5d  G2P1001 comes in c/o labor.  Otherwise has good fetal movement and no bleeding.  Past Medical History  Diagnosis Date  . Medical history non-contributory     Past Surgical History  Procedure Laterality Date  . Extraction of wisdom teeth      OB History  Gravida Para Term Preterm AB SAB TAB Ectopic Multiple Living  2 1 1       1     # Outcome Date GA Lbr Len/2nd Weight Sex Delivery Anes PTL Lv  2 Current           1 Term 02/25/13 2615w6d / 00:35 3.21 kg (7 lb 1.2 oz) F Vag-Spont EPI  Y      Social History   Social History  . Marital Status: Single    Spouse Name: N/A  . Number of Children: N/A  . Years of Education: N/A   Occupational History  . Not on file.   Social History Main Topics  . Smoking status: Former Games developermoker  . Smokeless tobacco: Not on file  . Alcohol Use: No     Comment: occ  . Drug Use: Yes    Special: Marijuana  . Sexual Activity: Yes    Birth Control/ Protection: None   Other Topics Concern  . Not on file   Social History Narrative   Cyclinex and Minocycline    Prenatal Transfer Tool  Maternal Diabetes: No Genetic Screening: Normal Maternal Ultrasounds/Referrals: Normal Fetal Ultrasounds or other Referrals:  None Maternal Substance Abuse:  No Significant Maternal Medications:  None Significant Maternal Lab Results: None  Other PNC: uncomplicated.    Filed Vitals:   03/08/16 0501 03/08/16 0531  BP: 105/71 93/59  Pulse: 55 54  Temp:    Resp: 18      Lungs/Cor:  NAD Abdomen:  soft, gravid Ex:  no cords, erythema SVE:  6/C/-2 on admission FHTs:  120s, good STV, NST R Toco:  q 3-5   A/P   Term labor.  GBS neg.  Jnai Snellgrove A

## 2016-03-08 NOTE — Anesthesia Postprocedure Evaluation (Signed)
Anesthesia Post Note  Patient: Cathy DoppBritney [name]  Procedure(s) Performed: * No procedures listed *  Patient location during evaluation: Mother Baby Anesthesia Type: Epidural Level of consciousness: awake Pain management: pain level controlled Vital Signs Assessment: post-procedure vital signs reviewed and stable Respiratory status: spontaneous breathing Cardiovascular status: stable Postop Assessment: no headache, no backache, epidural receding, patient able to bend at knees, no signs of nausea or vomiting and adequate PO intake Anesthetic complications: no    Last Vitals:  Filed Vitals:   03/08/16 0943 03/08/16 1437  BP: 121/69 114/68  Pulse: 50 50  Temp: 36.7 C 37 C  Resp: 16 16    Last Pain:  Filed Vitals:   03/08/16 1438  PainSc: 2                  Cathy [name]

## 2016-03-08 NOTE — Progress Notes (Signed)
CSW acknowledges consult for "Drug Exposed Newborn" and completed chart review which notes marijuana use prior to [redacted] weeks gestation.  CSW notes UDS has not been completed.  Due to weekend staffing limitations, CSW is unavailable to meet with patient at this time.  Please re-consult if acute concerns arise.  CSW will monitor result of umbilical cord tissue drug screen and follow up as needed.  

## 2016-03-08 NOTE — Anesthesia Preprocedure Evaluation (Signed)
Anesthesia Evaluation  Patient identified by MRN, date of birth, ID band Patient awake    Reviewed: Allergy & Precautions, NPO status , Patient's Chart, lab work & pertinent test results  History of Anesthesia Complications Negative for: history of anesthetic complications  Airway Mallampati: II  TM Distance: >3 FB Neck ROM: Full    Dental no notable dental hx. (+) Dental Advisory Given   Pulmonary former smoker,    Pulmonary exam normal breath sounds clear to auscultation       Cardiovascular negative cardio ROS Normal cardiovascular exam Rhythm:Regular Rate:Normal     Neuro/Psych negative neurological ROS  negative psych ROS   GI/Hepatic negative GI ROS, Neg liver ROS,   Endo/Other  negative endocrine ROS  Renal/GU negative Renal ROS  negative genitourinary   Musculoskeletal negative musculoskeletal ROS (+)   Abdominal   Peds negative pediatric ROS (+)  Hematology negative hematology ROS (+)   Anesthesia Other Findings   Reproductive/Obstetrics (+) Pregnancy                             Anesthesia Physical Anesthesia Plan  ASA: II  Anesthesia Plan: Epidural   Post-op Pain Management:    Induction:   Airway Management Planned:   Additional Equipment:   Intra-op Plan:   Post-operative Plan:   Informed Consent: I have reviewed the patients History and Physical, chart, labs and discussed the procedure including the risks, benefits and alternatives for the proposed anesthesia with the patient or authorized representative who has indicated his/her understanding and acceptance.   Dental advisory given  Plan Discussed with: CRNA  Anesthesia Plan Comments:         Anesthesia Quick Evaluation  

## 2016-03-08 NOTE — Anesthesia Procedure Notes (Signed)
Epidural Patient location during procedure: OB  Staffing Anesthesiologist: Myrah Strawderman Performed by: anesthesiologist   Preanesthetic Checklist Completed: patient identified, site marked, surgical consent, pre-op evaluation, timeout performed, IV checked, risks and benefits discussed and monitors and equipment checked  Epidural Patient position: sitting Prep: site prepped and draped and DuraPrep Patient monitoring: continuous pulse ox and blood pressure Approach: midline Location: L3-L4 Injection technique: LOR saline  Needle:  Needle type: Tuohy  Needle gauge: 17 G Needle length: 9 cm and 9 Needle insertion depth: 6 cm Catheter type: closed end flexible Catheter size: 19 Gauge Catheter at skin depth: 10 cm Test dose: negative  Assessment Events: blood not aspirated, injection not painful, no injection resistance, negative IV test and no paresthesia  Additional Notes Patient identified. Risks/Benefits/Options discussed with patient including but not limited to bleeding, infection, nerve damage, paralysis, failed block, incomplete pain control, headache, blood pressure changes, nausea, vomiting, reactions to medication both or allergic, itching and postpartum back pain. Confirmed with bedside nurse the patient's most recent platelet count. Confirmed with patient that they are not currently taking any anticoagulation, have any bleeding history or any family history of bleeding disorders. Patient expressed understanding and wished to proceed. All questions were answered. Sterile technique was used throughout the entire procedure. Please see nursing notes for vital signs. Test dose was given through epidural catheter and negative prior to continuing to dose epidural or start infusion. Warning signs of high block given to the patient including shortness of breath, tingling/numbness in hands, complete motor block, or any concerning symptoms with instructions to call for help. Patient was  given instructions on fall risk and not to get out of bed. All questions and concerns addressed with instructions to call with any issues or inadequate analgesia.    

## 2016-03-08 NOTE — Lactation Note (Signed)
This note was copied from a baby's chart. Lactation Consultation Note  Initial visit made.  Breastfeeding consultation services and support information given and reviewed.  This is mom's second baby but first time breastfeeding.  Baby is 5 hours old and mom states baby has latched to nipple only.  Discussed waking baby well and attempting skin to skin.  Mom can easily hand express colostrum.  Instructed to feed with any feeding cue and to call for assist prn.  Attempted latch during visit but baby too sleepy.  Patient Name: Girl Phil DoppBritney Canan ZOXWR'UToday's Date: 03/08/2016 Reason for consult: Initial assessment   Maternal Data    Feeding Feeding Type: Breast Fed  LATCH Score/Interventions Latch: Too sleepy or reluctant, no latch achieved, no sucking elicited. Intervention(s): Adjust position;Assist with latch;Breast massage;Breast compression  Audible Swallowing: None  Type of Nipple: Everted at rest and after stimulation  Comfort (Breast/Nipple): Soft / non-tender     Hold (Positioning): Assistance needed to correctly position infant at breast and maintain latch.  LATCH Score: 5  Lactation Tools Discussed/Used     Consult Status Consult Status: Follow-up Date: 03/09/16 Follow-up type: In-patient    Huston FoleyMOULDEN, Saud Bail S 03/08/2016, 11:46 AM

## 2016-03-09 LAB — CBC
HCT: 30.3 % — ABNORMAL LOW (ref 36.0–46.0)
Hemoglobin: 10 g/dL — ABNORMAL LOW (ref 12.0–15.0)
MCH: 30.9 pg (ref 26.0–34.0)
MCHC: 33 g/dL (ref 30.0–36.0)
MCV: 93.5 fL (ref 78.0–100.0)
Platelets: 181 10*3/uL (ref 150–400)
RBC: 3.24 MIL/uL — ABNORMAL LOW (ref 3.87–5.11)
RDW: 14.5 % (ref 11.5–15.5)
WBC: 8.4 10*3/uL (ref 4.0–10.5)

## 2016-03-09 NOTE — Progress Notes (Signed)
UR chart review completed.  

## 2016-03-09 NOTE — Progress Notes (Signed)
Post Partum Day 1 Subjective: no complaints, up ad lib, voiding and tolerating PO  Baby with tight tongue frenulum making breastfeeding difficult.   Objective: Blood pressure 123/69, pulse 55, temperature 98.6 F (37 C), temperature source Oral, resp. rate 18, height 5\' 5"  (1.651 m), weight 80.287 kg (177 lb), SpO2 100 %, unknown if currently breastfeeding.  Physical Exam:  General: alert, cooperative and appears stated age Lochia: appropriate Uterine Fundus: firm   Recent Labs  03/08/16 0207 03/09/16 0517  HGB 10.8* 10.0*  HCT 32.0* 30.3*    Assessment/Plan: Plan for discharge tomorrow and Breastfeeding   LOS: 1 day   Tania Steinhauser H. 03/09/2016, 8:50 AM

## 2016-03-09 NOTE — Lactation Note (Signed)
This note was copied from a baby's chart. Lactation Consultation Note Mom c/o nipple pain when latch. Has good everted small nipples and the baby would normally be able to latch but baby has very limited tongue movement and recessed chin. Mom had a #20 NS and yelled in pain. Fitted mom w/#16 NS, mom stated it felt better. Mom nipples are are at base, lt. Nipple looks better than the Rt. Rt. Nipple is slightly bruised on tip of nipple and at base. Gave mom coconut oil. Assisted in latching baby. Denied painful latching. Mom has pendulum breast w/nipple turning in towards moms body. Elevated breast. Breast very soft. Mom hand expresses quiet rough, encouraged to lighten up on the expression. Mom stated it didn't hurt, that the way she has been doing it for a months. Reviewed BF positioning and props. Encouraged chin tug if needed. Encouraged to monitor I&O. Baby had clear fluid emesis before BF.  Patient Name: Girl Phil DoppBritney [name] EAVWU'JToday's Date: 03/09/2016 Reason for consult: Follow-up assessment;Breast/nipple pain;Difficult latch   Maternal Data    Feeding Feeding Type: Breast Fed Length of feed: 15 min (still bf)  LATCH Score/Interventions Latch: Grasps breast easily, tongue down, lips flanged, rhythmical sucking. Intervention(s): Skin to skin;Teach feeding cues;Waking techniques Intervention(s): Adjust position;Assist with latch;Breast massage;Breast compression  Audible Swallowing: Spontaneous and intermittent Intervention(s): Hand expression Intervention(s): Alternate breast massage  Type of Nipple: Everted at rest and after stimulation  Comfort (Breast/Nipple): Filling, red/small blisters or bruises, mild/mod discomfort  Problem noted: Severe discomfort  Hold (Positioning): Assistance needed to correctly position infant at breast and maintain latch. Intervention(s): Support Pillows;Position options;Skin to skin  LATCH Score: 8  Lactation Tools Discussed/Used Tools: Nipple  Dorris CarnesShields;Other (comment) (coconut oil) Nipple shield size: 16   Consult Status Consult Status: Follow-up Date: 03/10/16 Follow-up type: In-patient    Charyl DancerCARVER, Dylin Ihnen G 03/09/2016, 5:47 AM

## 2016-03-09 NOTE — Lactation Note (Signed)
This note was copied from a baby's chart. Lactation Consultation Note  Patient Name: Cathy Phil DoppBritney Schwartzman WUJWJ'XToday's Date: 03/09/2016 Reason for consult: Follow-up assessment   With this mom and term baby, previously diagnosed with  anterior tongue tie, and lactation was  asked by Dr.  Ezequiel EssexGable to assess baby's tongue. The baby was asleep, and would not suck on my finger. I spoke to the parents, and explained that Cathy [name] being able to extend her tongue beyond her gum line is not all that her tongue needs to do to effectively breast feed. I did give resources to the parents to use to educate themselves on the topic, and make an informed decision. I told mom it was her decision to make. Mom is having a DEP set up , and I advised mom to supplement with EBM, in addition to breast feeding.Mom has a steady flow of transitional milk with hand expression.  Mom reports latch less painful than yesterday. Mom knows to call for questions/concerns.    Maternal Data    Feeding Feeding Type: Breast Fed Length of feed: 5 min (per mom baby falls asleep)  LATCH Score/Interventions                      Lactation Tools Discussed/Used     Consult Status Consult Status: Follow-up Date: 03/10/16 Follow-up type: In-patient    Cathy [name], Cathy [name] 03/09/2016, 2:05 PM

## 2016-03-09 NOTE — Clinical Social Work Maternal (Signed)
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 Substance Use-- MOB presents with a history of marijuana use. Infant's UDS is positive for marijuana, and umbilical cord drug panel is pending.    Cognitive State:  Able to Concentrate , Alert , Goal Oriented , Linear Thinking    Mood/Affect:  Happy , Comfortable , Calm    CSW Assessment:  CSW received request for consult due to MOB presenting with a history of marijuana use. FOB was present in the room during the assessment. MOB and FOB presented as easily engaged and receptive to the visit. MOB displayed a full range in affect, and was in a pleasant mood.   MOB and FOB openly processed thoughts and feelings as they prepare to transition caring for two children. MOB shared that she is "nervous", but discussed an awareness that everything will be "okay". She shared that she has strong family support, has been able to ask for  feedback from family on how to transition to two children, and knows that the FOB will be at home from work for 2 weeks to assist with the transition home.  MOB shared that overall she feels "excited", and is looking forward to going home when medically ready.   MOB shared that she is concerned about the infant's feedings since she has been told that the infant is tongue tied.  She reported that she wants the infant to have breast milk since she did not breastfeed her first child.  MOB shared that she wants to ensure that the infant is fed, but is also nervous about the thought of having the infant's frenulum clipped.  CSW provided validated her feelings, and continued to assist her to process how she felt about the tongue tie and breastfeeding. MOB shared that she is not strongly attached to putting the infant to the breast, and that she only wants the infant to receive the breast milk due to the health benefits.  MOB was able to recognize that there is no right or wrong way to feed the infant, and that she needs to pick the option that is best for her, the infant, and her family. MOB continues to process how she wants to feed the infant, and stated that she is looking forward to learning more about her options.   MOB denied history of perinatal mood and anxiety disorders. She reported baseline depressive symptoms and anxiety, but denied formal diagnosis. MOB also denied belief that symptoms   negatively impacted her day to day activities and ability to function.  CSW provided education on perinatal mood and anxiety disorders, and was receptive to exploring signs and symptoms that indicate need to follow up with her medical provider.   MOB confirmed history of marijuana use.  MOB stated that prior to becoming a mother, she engaged in routine use. She stated that with her first pregnancy, she reduced use, but continued to assist with nausea.  MOB shared that her first child tested positive for marijuana, and CPS  became involved. MOB denied any negative outcomes secondary to their involvement. MOB shared that she has been honest with her doctor about her marijuana use with this pregnancy.  MOB did not clarify exact use, but reported that her last use was approximately one month ago. She stated that she was using e-cigarettes with her friend on her birthday, and was trying out different flavors. MOB shared that she did not know that she used marijuana until after she got "high".  MOB expressed concern about the infant's urine drug screen being positive based on this event.   She stated that she knows that even if it positive, that she will be okay since she can cope with CPS involvement.   CSW informed MOB and FOB that the infant's urine is positive for marijuana. MOB and FOB asked appropriate questions about what to anticipate and expect. CSW provided education, and MOB and FOB denied additional questions, concerns, or needs at this time.   CSW Plan/Description:   1. Patient/Family Education -- Perinatal mood and anxiety disorders, hospital drug screen policy 2. [place] Child Protective Service Report -- will follow up within 72 hours of receiving the report once discharged from the hospital  3.  CSW to monitor infant's umbilical cord drug screen. 4. No Further Intervention Required/No Barriers to Discharge    Lashone Stauber N, LCSW 03/09/2016, 12:01 PM  

## 2016-03-10 MED ORDER — IBUPROFEN 800 MG PO TABS: 800.0000 mg | ORAL_TABLET | Freq: Four times a day (QID) | ORAL | Status: AC | PRN

## 2016-03-10 NOTE — Discharge Summary (Signed)
Obstetric Discharge Summary Reason for Admission: onset of labor Prenatal Procedures: none Intrapartum Procedures: spontaneous vaginal delivery Postpartum Procedures: none Complications-Operative and Postpartum: none HEMOGLOBIN  Date Value Ref Range Status  03/09/2016 10.0* 12.0 - 15.0 g/dL Final   HCT  Date Value Ref Range Status  03/09/2016 30.3* 36.0 - 46.0 % Final    Physical Exam:  General: alert, cooperative and appears stated age 83Lochia: appropriate Uterine Fundus: firm DVT Evaluation: No evidence of DVT seen on physical exam.  Discharge Diagnoses: Term Pregnancy-delivered  Discharge Information: Date: 03/10/2016 Activity: pelvic rest Diet: routine Medications: PNV and Ibuprofen Condition: stable Instructions: refer to practice specific booklet Discharge to: home Follow-up Information    Follow up with HORVATH,MICHELLE A, MD In 4 weeks.   Specialty:  Obstetrics and Gynecology   Contact information:   [address] [place] [address] [phone]       Newborn Data: Live born female  Birth Weight: 6 lb 11.8 oz (3055 g) APGAR: 9, 9  Home with mother.  Hajira Verhagen GEFFEL Kiyo Heal 03/10/2016, 7:45 AM

## 2016-03-10 NOTE — Progress Notes (Signed)
Patient is doing well.  She is ambulating, voiding, tolerating PO.  Pain control is good.  Lochia is appropriate  Filed Vitals:   03/08/16 1437 03/09/16 0609 03/09/16 1830 03/10/16 0536  BP:  123/69 133/88 103/64  Pulse:  55 63 61  Temp:  98.6 F (37 C) 98.3 F (36.8 C) 98.7 F (37.1 C)  TempSrc:  Oral Oral Oral  Resp:  18 18 18   Height:      Weight:      SpO2: 100%       NAD Fundus firm Ext: no edema  Lab Results  Component Value Date   WBC 8.4 03/09/2016   HGB 10.0* 03/09/2016   HCT 30.3* 03/09/2016   MCV 93.5 03/09/2016   PLT 181 03/09/2016    --/--/A POS, A POS (04/23 0207)/RImmune  A/P 25 y.o. [id number] PPD#2 s/p TSVD.  Routine care.   Expect d/c today.    [facility] HospitalDYANNA GEFFEL [employer]CL[employer]

## 2016-03-10 NOTE — Progress Notes (Signed)
RN informed CSW of MOB presenting as tearful and overwhelmed during previous shift as MOB disclosed relational dynamics between herself and the MGM.   CSW followed up with MOB prior to discharge in order to provide her with additional opportunity to process her feelings.  MOB became tearful as she discussed her feelings of frustration with her mother. She shared that she is not surprised by her mother's behaviors of being minimally supportive, since her behaviors are congruent with her past.  MOB stated that she is attempting to not give her mother power, and she is attempting to establish boundaries with her mother, but it continues to be difficult since she had hoped that her mother would have been able to be involved and supportive in a healthy way with the birth of this infant.  MOB responded to normalization of feelings from CSW.  She was also receptive to exploring evidence and strategies that demonstrate that she is "okay".  MOB demonstrated use of positive self-talk, and was able to recognize her strengths that demonstrates that she is a good mother.  She discussed how she knows that she is not alone, and that she knows that she cannot change her mother's behaviors and level of support. MOB again expressed gratitude for the support that she does have from the FOB, and smiled as she reflected upon how she feels as she prepares for discharge home.

## 2016-03-10 NOTE — Lactation Note (Signed)
This note was copied from a baby's chart. Lactation Consultation Note  Patient Name: Girl Phil DoppBritney Carriker ZOXWR'UToday's Date: 03/10/2016 Reason for consult: Follow-up assessment;Difficult latch;Breast/nipple pain Mom has decided to pump and bottle feed due to difficult latch and nipple pain. Mom reports she has Medela DEBP at home. Mom reports receiving 10 ml with pumping. LC discussed with Mom that she needs to increase supplement volumes since baby not going to breast. Hand out with recommended supplement amounts when not going to breast reviewed with Mom. Advised to increase to 30-60 ml today every 3 hours today. Start with 30 ml to see how baby tolerates then increase by 5 ml increments to satisfy baby. Mom to pump every 3 hours for 15 minutes to encourage milk production, prevent engorgement and protect milk supply. Engorgement care reviewed if needed, refer to Baby N Me booklet, page 24, monitor voids/stools, refer to chart in Baby N Me booklet, page 24 and breast milk storage guidelines page 25. Offered OP f/u if she would like to continue to work with baby at breast, Mom will advise. Encouraged support group.   Maternal Data    Feeding Feeding Type: Bottle Fed - Breast Milk  LATCH Score/Interventions                      Lactation Tools Discussed/Used Tools: Pump;Nipple Dorris CarnesShields;Other (comment) (coconut oil) Breast pump type: Double-Electric Breast Pump   Consult Status Consult Status: Complete Date: 03/10/16 Follow-up type: In-patient    Alfred LevinsGranger, Sairah Knobloch Ann 03/10/2016, 10:19 AM

## 2016-05-14 ENCOUNTER — Encounter (HOSPITAL_COMMUNITY): Payer: Self-pay

## 2016-05-14 ENCOUNTER — Emergency Department (HOSPITAL_COMMUNITY)
Admission: EM | Admit: 2016-05-14 | Discharge: 2016-05-14 | Disposition: A | Discharge status: Home / Self Care | Payer: Medicaid Other | Attending: Emergency Medicine | Admitting: Emergency Medicine

## 2016-05-14 DIAGNOSIS — Z87891 Personal history of nicotine dependence: Secondary | ICD-10-CM | POA: Diagnosis not present

## 2016-05-14 DIAGNOSIS — M6740 Ganglion, unspecified site: Secondary | ICD-10-CM

## 2016-05-14 DIAGNOSIS — M67431 Ganglion, right wrist: Secondary | ICD-10-CM | POA: Insufficient documentation

## 2016-05-14 DIAGNOSIS — M25531 Pain in right wrist: Secondary | ICD-10-CM | POA: Diagnosis present

## 2016-05-14 MED ORDER — NAPROXEN 500 MG PO TABS: 500.0000 mg | ORAL_TABLET | Freq: Two times a day (BID) | ORAL | Status: AC

## 2016-05-14 NOTE — ED Notes (Signed)
PT DISCHARGED. INSTRUCTIONS AND PRESCRIPTION GIVEN. AAOX4. PT IN NO APPARENT DISTRESS. THE OPPORTUNITY TO ASK QUESTIONS WAS PROVIDED. 

## 2016-05-14 NOTE — ED Notes (Signed)
PT C/O RIGHT WRIST PAIN FROM A RECURRING "LUMP" OVER THE LAST 5 YEARS. PT STATES THE LUMP AND APPEARS AND DISAPPEARS ON ITS OWN, BUT WHEN IT COMES BACK SHE HAS PAIN WHEN HOLDING OR LIFTING OBJECTS. DENIES RECENT INJURY.

## 2016-05-14 NOTE — Discharge Instructions (Signed)
Please read and follow all provided instructions.  Your diagnoses today include:  1. Ganglion cyst    Tests performed today include:  Vital signs. See below for your results today.   Medications prescribed:   Take as prescribed   Home care instructions:  Follow any educational materials contained in this packet.  Follow-up instructions: Please follow-up with your Orthopedic provider for further evaluation of symptoms and treatment   Return instructions:   Please return to the Emergency Department if you do not get better, if you get worse, or new symptoms OR  - Fever (temperature greater than 101.55F)  - Bleeding that does not stop with holding pressure to the area    -Severe pain (please note that you may be more sore the day after your accident)  - Chest Pain  - Difficulty breathing  - Severe nausea or vomiting  - Inability to tolerate food and liquids  - Passing out  - Skin becoming red around your wounds  - Change in mental status (confusion or lethargy)  - New numbness or weakness     Please return if you have any other emergent concerns.  Additional Information:  Your vital signs today were: BP 102/62 mmHg   Pulse 100   Temp(Src) 98.4 F (36.9 C) (Oral)   Resp 16   Ht 5\' 5"  (1.651 m)   Wt 68.04 kg   BMI 24.96 kg/m2   SpO2 99%   LMP 05/05/2016   Breastfeeding? No If your blood pressure (BP) was elevated above 135/85 this visit, please have this repeated by your doctor within one month. ---------------

## 2016-05-14 NOTE — ED Provider Notes (Signed)
CSN: [id number]     Arrival date & time 05/14/16  1317 History  By signing my name below, I, Evon Slackerrance Branch, attest that this documentation has been prepared under the direction and in the presence of Audry Piliyler Treyvonne Tata, PA-C.  Electronically Signed: Evon Slackerrance Branch, ED Scribe. 05/14/2016. 1:41 PM.    Chief Complaint  Patient presents with  . Wrist Pain    LUMP ON RIGHT WRIST   The history is provided by the patient. No language interpreter was used.   HPI Comments: Cathy [name] is a 25 y.o. female who presents to the Emergency Department complaining of recurrent cyst on right wrist that began several years. She states when the cyst appears it causes her to have pain in her right wrist. She states that the pain is worse with movement of the wrist. She states that the cyst usally resolves on its own. Pt states that cyst usually resolves within 4 days to 2 weeks. She states that she has tried compression wrapping and tylenol with no relief. Pain is 5/10. Pt doesn't report numbness or tingling.   Past Medical History  Diagnosis Date  . Medical history non-contributory    Past Surgical History  Procedure Laterality Date  . Extraction of wisdom teeth     Family History  Problem Relation Age of Onset  . Cancer Father     lung   Social History  Substance Use Topics  . Smoking status: Former Games developermoker  . Smokeless tobacco: Not on file  . Alcohol Use: No     Comment: occ   OB History    Gravida Para Term Preterm AB TAB SAB Ectopic Multiple Living   2 2 2       0 2      Review of Systems  Musculoskeletal: Positive for arthralgias.  Neurological: Negative for numbness.     Allergies  Cyclinex and Minocycline  Home Medications   Prior to Admission medications   Medication Sig Start Date End Date Taking? Authorizing Provider  acetaminophen (TYLENOL) 325 MG tablet Take 650 mg by mouth every 6 (six) hours as needed for mild pain or headache.    Historical Provider, MD  ibuprofen  (ADVIL,MOTRIN) 800 MG tablet Take 1 tablet (800 mg total) by mouth every 6 (six) hours as needed. 03/10/16   Marlow Baarsyanna Clark, MD  Prenatal Vit-Fe Fumarate-FA (MULTIVITAMIN-PRENATAL) 27-0.8 MG TABS tablet Take 1 tablet by mouth daily.     Historical Provider, MD  ranitidine (ZANTAC) 150 MG tablet Take 150 mg by mouth 2 (two) times daily as needed for heartburn.     Historical Provider, MD   BP 102/62 mmHg  Pulse 100  Temp(Src) 98.4 F (36.9 C) (Oral)  Resp 16  Ht 5\' 5"  (1.651 m)  Wt 150 lb (68.04 kg)  BMI 24.96 kg/m2  SpO2 99%  LMP 05/05/2016  Breastfeeding? No   Physical Exam  Constitutional: She is oriented to person, place, and time. She appears well-developed and well-nourished. No distress.  HENT:  Head: Normocephalic and atraumatic.  Eyes: Conjunctivae and EOM are normal.  Neck: Neck supple. No tracheal deviation present.  Cardiovascular: Normal rate.   Pulmonary/Chest: Effort normal. No respiratory distress.  Musculoskeletal: Normal range of motion.  1 cm ganglionic cyst on anterior portion of right wist. Small. Mobile. TTP. Pearl like on palpation. ROM intact. Motor/senstory intact. Neurovascularly intact.    Neurological: She is alert and oriented to person, place, and time.  Skin: Skin is warm and dry.  Psychiatric: She has a  normal mood and affect. Her behavior is normal.  Nursing note and vitals reviewed.   ED Course  Procedures (including critical care time) DIAGNOSTIC STUDIES: Oxygen Saturation is 99% on RA, normal by my interpretation.    COORDINATION OF CARE: 1:43 PM-Discussed treatment plan which includes orthopedic follow up with pt at bedside and pt agreed to plan.   Labs Review Labs Reviewed - No data to display  Imaging Review No results found.    EKG Interpretation None      MDM  I have reviewed the relevant previous healthcare records. I obtained HPI from historian.  ED Course:  Assessment: Pt is a 25yF who presents with anterior ganglion  cyst on right wrist. On exam, pt in NAD. Nontoxic/nonseptic appearing. VSS. Afebrile. Given wrist splint in ED. Plan is to DC home with follow up to Ortho for possible surgical excision given reoccurrence for the past 5 years. At time of discharge, Patient is in no acute distress. Vital Signs are stable. Patient is able to ambulate. Patient able to tolerate PO.    Disposition/Plan:  DC Home Additional Verbal discharge instructions given and discussed with patient.  Pt Instructed to f/u with Ortho in the next week for evaluation and treatment of symptoms. Return precautions given Pt acknowledges and agrees with plan  Supervising Physician No att. providers found   Final diagnoses:  Ganglion cyst     I personally performed the services described in this documentation, which was scribed in my presence. The recorded information has been reviewed and is accurate.     Audry Piliyler Salih Williamson, PA-C 05/14/16 1347  Tilden FossaElizabeth Rees, MD 05/16/16 [phone]
